# Patient Record
Sex: Female | Born: 1940 | Race: White | Hispanic: No | Smoking: Never smoker
Health system: Southern US, Community
[De-identification: ages and names within clinical notes are randomized; demographics above are authoritative.]

## PROBLEM LIST (undated history)

## (undated) DIAGNOSIS — I8390 Asymptomatic varicose veins of unspecified lower extremity: Secondary | ICD-10-CM

## (undated) DIAGNOSIS — N189 Chronic kidney disease, unspecified: Secondary | ICD-10-CM

## (undated) DIAGNOSIS — E7800 Pure hypercholesterolemia, unspecified: Secondary | ICD-10-CM

## (undated) DIAGNOSIS — K5909 Other constipation: Secondary | ICD-10-CM

## (undated) DIAGNOSIS — I10 Essential (primary) hypertension: Secondary | ICD-10-CM

## (undated) DIAGNOSIS — R7303 Prediabetes: Principal | ICD-10-CM

## (undated) DIAGNOSIS — K219 Gastro-esophageal reflux disease without esophagitis: Secondary | ICD-10-CM

## (undated) DIAGNOSIS — R600 Localized edema: Secondary | ICD-10-CM

## (undated) HISTORY — DX: Chronic kidney disease, unspecified: N18.9

## (undated) HISTORY — PX: OTHER SURGICAL HISTORY: SHX169

## (undated) HISTORY — PX: CATARACT EXTRACTION: SUR2

## (undated) HISTORY — DX: Other constipation: K59.09

## (undated) HISTORY — DX: Asymptomatic varicose veins of unspecified lower extremity: I83.90

## (undated) HISTORY — DX: Localized edema: R60.0

## (undated) HISTORY — DX: Essential (primary) hypertension: I10

## (undated) HISTORY — DX: Gastro-esophageal reflux disease without esophagitis: K21.9

## (undated) HISTORY — DX: Prediabetes: R73.03

## (undated) HISTORY — PX: LASIK: SHX215

## (undated) HISTORY — DX: Pure hypercholesterolemia, unspecified: E78.00

## (undated) HISTORY — PX: COLONOSCOPY: SHX174

## (undated) HISTORY — PX: BREAST EXCISIONAL BIOPSY: SUR124

---

## 1985-03-23 HISTORY — PX: ABDOMINAL HYSTERECTOMY: SHX81

## 1998-08-26 ENCOUNTER — Ambulatory Visit (HOSPITAL_COMMUNITY): Admission: RE | Admit: 1998-08-26 | Discharge: 1998-08-26 | Payer: Self-pay | Admitting: Family Medicine

## 1998-08-26 ENCOUNTER — Encounter: Payer: Self-pay | Admitting: Family Medicine

## 1999-02-05 ENCOUNTER — Encounter: Payer: Self-pay | Admitting: Family Medicine

## 1999-02-05 ENCOUNTER — Encounter: Admission: RE | Admit: 1999-02-05 | Discharge: 1999-02-05 | Payer: Self-pay | Admitting: Family Medicine

## 2000-02-13 ENCOUNTER — Encounter: Admission: RE | Admit: 2000-02-13 | Discharge: 2000-02-13 | Payer: Self-pay | Admitting: Family Medicine

## 2000-02-13 ENCOUNTER — Encounter: Payer: Self-pay | Admitting: Family Medicine

## 2000-03-27 ENCOUNTER — Ambulatory Visit (HOSPITAL_COMMUNITY): Admission: RE | Admit: 2000-03-27 | Discharge: 2000-03-27 | Payer: Self-pay | Admitting: Family Medicine

## 2001-10-07 ENCOUNTER — Encounter: Payer: Self-pay | Admitting: Emergency Medicine

## 2001-10-07 ENCOUNTER — Emergency Department (HOSPITAL_COMMUNITY): Admission: EM | Admit: 2001-10-07 | Discharge: 2001-10-07 | Payer: Self-pay | Admitting: Emergency Medicine

## 2003-04-25 ENCOUNTER — Other Ambulatory Visit: Admission: RE | Admit: 2003-04-25 | Discharge: 2003-04-25 | Payer: Self-pay | Admitting: Family Medicine

## 2003-07-28 ENCOUNTER — Emergency Department (HOSPITAL_COMMUNITY): Admission: EM | Admit: 2003-07-28 | Discharge: 2003-07-29 | Payer: Self-pay

## 2005-08-25 ENCOUNTER — Encounter: Admission: RE | Admit: 2005-08-25 | Discharge: 2005-08-25 | Payer: Self-pay | Admitting: Family Medicine

## 2006-07-13 ENCOUNTER — Inpatient Hospital Stay (HOSPITAL_COMMUNITY): Admission: AD | Admit: 2006-07-13 | Discharge: 2006-07-15 | Payer: Self-pay | Admitting: Cardiology

## 2006-07-14 ENCOUNTER — Encounter (INDEPENDENT_AMBULATORY_CARE_PROVIDER_SITE_OTHER): Payer: Self-pay | Admitting: Interventional Cardiology

## 2006-08-02 ENCOUNTER — Ambulatory Visit: Payer: Self-pay | Admitting: Cardiovascular Disease

## 2006-08-02 ENCOUNTER — Ambulatory Visit (HOSPITAL_COMMUNITY): Admission: RE | Admit: 2006-08-02 | Discharge: 2006-08-02 | Payer: Self-pay | Admitting: Cardiology

## 2006-08-06 ENCOUNTER — Ambulatory Visit: Payer: Self-pay | Admitting: Internal Medicine

## 2006-08-09 ENCOUNTER — Ambulatory Visit: Payer: Self-pay | Admitting: Internal Medicine

## 2006-08-17 ENCOUNTER — Ambulatory Visit (HOSPITAL_COMMUNITY): Admission: RE | Admit: 2006-08-17 | Discharge: 2006-08-17 | Payer: Self-pay | Admitting: Internal Medicine

## 2006-08-20 ENCOUNTER — Ambulatory Visit: Payer: Self-pay

## 2006-08-20 ENCOUNTER — Ambulatory Visit: Payer: Self-pay | Admitting: Internal Medicine

## 2006-09-15 ENCOUNTER — Ambulatory Visit: Payer: Self-pay | Admitting: Internal Medicine

## 2006-10-05 ENCOUNTER — Encounter: Admission: RE | Admit: 2006-10-05 | Discharge: 2006-10-05 | Payer: Self-pay | Admitting: Family Medicine

## 2007-01-05 ENCOUNTER — Ambulatory Visit: Payer: Self-pay | Admitting: Internal Medicine

## 2007-04-06 ENCOUNTER — Ambulatory Visit: Payer: Self-pay | Admitting: Internal Medicine

## 2007-11-02 ENCOUNTER — Encounter: Admission: RE | Admit: 2007-11-02 | Discharge: 2007-11-02 | Payer: Self-pay | Admitting: Family Medicine

## 2007-11-16 ENCOUNTER — Encounter: Admission: RE | Admit: 2007-11-16 | Discharge: 2007-11-16 | Payer: Self-pay | Admitting: Family Medicine

## 2009-05-15 ENCOUNTER — Encounter: Admission: RE | Admit: 2009-05-15 | Discharge: 2009-05-15 | Payer: Self-pay | Admitting: Family Medicine

## 2009-05-29 ENCOUNTER — Encounter: Admission: RE | Admit: 2009-05-29 | Discharge: 2009-05-29 | Payer: Self-pay | Admitting: Family Medicine

## 2009-11-27 ENCOUNTER — Encounter: Admission: RE | Admit: 2009-11-27 | Discharge: 2009-11-27 | Payer: Self-pay | Admitting: Family Medicine

## 2010-01-15 LAB — HM DEXA SCAN

## 2010-04-13 ENCOUNTER — Encounter: Payer: Self-pay | Admitting: Family Medicine

## 2010-04-14 ENCOUNTER — Encounter: Payer: Self-pay | Admitting: Family Medicine

## 2010-04-23 ENCOUNTER — Other Ambulatory Visit: Payer: Self-pay | Admitting: Family Medicine

## 2010-04-23 DIAGNOSIS — Z09 Encounter for follow-up examination after completed treatment for conditions other than malignant neoplasm: Secondary | ICD-10-CM

## 2010-05-16 ENCOUNTER — Ambulatory Visit
Admission: RE | Admit: 2010-05-16 | Discharge: 2010-05-16 | Disposition: A | Discharge status: Home / Self Care | Payer: Medicare Other | Source: Ambulatory Visit | Attending: Family Medicine | Admitting: Family Medicine

## 2010-05-16 DIAGNOSIS — Z09 Encounter for follow-up examination after completed treatment for conditions other than malignant neoplasm: Secondary | ICD-10-CM

## 2010-08-05 NOTE — Letter (Signed)
Aug 20, 2006    Dario Guardian, M.D.  [address]  [place], [place] [address]   RE:  Valerie Nguyen, Valerie Nguyen  MRN:  [id number]  /  DOB:  1940-12-10   Dictation ended at this point.    Sincerely,      Duke Salvia, MD, [facility]  Electronically Signed    SCK/MedQ  DD: 08/20/2006  DT: 08/21/2006  Job #: [id number]

## 2010-08-05 NOTE — Letter (Signed)
Aug 20, 2006    Dario Guardian, M.D.  [address]  [place], [place] [address]   RE:  Valerie Nguyen, Valerie Nguyen  MRN:  [id number]  /  DOB:  1941/01/24   Dear Valerie Nguyen:   Valerie Nguyen came in today for ongoing evaluation for ventricular  tachycardia in the setting of an abnormal MRI.  Her signal average ECG  was normal.  Her metoprolol at 100 mg a day was associated with marked  improvement in her symptoms, and inadvertently we had her stop it today  in anticipation of her treadmill, and she came in with long runs of non-  sustained ventricular tachycardia.   Her blood pressure was 154/94.  LUNGS:  Clear.  HEART:  Sounds were regular.   I have suggested that we increase her metoprolol to 100 mg twice daily  and she is to make sure that this is a sustained form.   We will plan to see her again in 3 weeks' time or so.  In the event that  she has ongoing symptoms, alternatives would include the addition of a  class IC antiarrhythmic drug, as well as catheter ablation.   In the interim, we will need to get her MRI copied, and I will have Dr.  Modena Nunnery at [facility] take a look at it.   At this point, she fails to meet formal criteria for arrhythmogenic RV  dysplasia, although I think I may have mentioned to you previously that  they are in the process of rewriting these criteria.   Valerie Nguyen, again, thank you for all of your help and for allowing [place] to  participate in the care of your patient.    Sincerely,      Valerie Salvia, MD, [facility]  Electronically Signed    SCK/MedQ  DD: 08/20/2006  DT: 08/20/2006  Job #: [id number]   CC:    Armanda Magic, M.D.

## 2010-08-05 NOTE — Letter (Signed)
Aug 06, 2006    Valerie Nguyen, M.D.  [address]  [place], [place] [address]   RE:  Valerie Nguyen  MRN:  [id number]  /  DOB:  03/14/1941   Dear Valerie Nguyen,   It was a pleasure to see Valerie Nguyen at your request today.  Valerie Nguyen is a 70 year old woman who has been having problems with  palpitations, which by event recorder have been demonstrated to be  sustained ventricular tachycardia with a morphology QRS that is similar  to what appears to be lead II or V6.   As you know, she is a 70 year old woman who is under a great deal of  stress.  Her husband is disabled, and she cares for him at home.  Her  daughter is a recently recovered addict, whom she also cares for at  home, who had some palpitations about 5-6 years ago.   Over the last three months, she has noted increasing problems with  palpitations.  These have been accompanied by a sense of some shortness  of breath and a chest pressure.  Because of the aforementioned  constellation, she underwent catheterization that demonstrated normal  left ventricular function.  Somewhere I read in the reports that you  kindly furnished, that there was a question of an apical endocardial  echogenicity, the cause of which was unclear.  With that in mind, she  underwent MRI scanning, which was notable for normal left ventricular  function.  The left ventricular cavity was described as normal.  The  right ventricular cavity, however, was notable for areas of dyssynergy  on the free wall as well as areas of dyskinesis.  This area apparently  corresponded to significant fatty infiltration and a feeling that this  was consistent with ARVD.   The patient has no history of syncope nor any known family history.  Her  father died suddenly, but this was in the context of an MI.  He  apparently was resuscitated and had another MI in the hospital.  His  grandfather died suddenly.  There is no premature death that is noted.   She  herself has no syncope.  She does have episodes of dizziness but  this always occurs with rapid changes in rotation of a vertical  position, not with standing.   She does not have problems while driving, looking over her shoulder,  wearing tight collars, or with looking up.   PAST MEDICAL HISTORY:  Her past medical history, in addition to the  above, is notable for:  1. Hypertension.  2. Dyslipidemia.  3. History of cellulitis.   PAST SURGICAL HISTORY:  Notable for an abdominal hysterectomy.   REVIEW OF SYSTEMS:  In addition to the above is notable for:  1. GE reflux disease.  2. Constipation.  3. Anemia.  4. Arthritis.  5. Venous insufficiency and secondary venous stasis.   SOCIAL HISTORY:  She is married, as previously noted.  She has three  children.  She does not use cigarettes, alcohol, or recreational drugs.  She does drink a fair amount of caffeine but has noted no particular  association with her ventricular tachycardia.  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   MEDICATIONS:  Her medications include hydrochlorothiazide, metoprolol  50, now taking 100, Vytorin, and potassium.   PHYSICAL EXAMINATION:  GENERAL:  She is an older Caucasian female  appearing her stated age of 70.  VITAL SIGNS:  Her blood pressure is 145/82.  Her pulse is 69.  There is  no orthostatic change.  HEENT:  No icterus or xanthomata.  NECK:  Neck veins are flat.  Carotids are brisk and full bilaterally  without bruits.  BACK:  Without kyphosis or scoliosis.  LUNGS:  Clear.  CARDIAC:  Heart sounds were regular today without murmurs or gallops.  ABDOMEN:  Soft with active bowel sounds.  No midline pulsation or  hepatomegaly.  EXTREMITIES:  Femoral pulses were 2+.  Distal pulses were intact.  There  was no clubbing, cyanosis or edema.  NEUROLOGIC:  Grossly normal.  SKIN:  Warm and dry.   Electrocardiogram dated today demonstrated sinus rhythm.  The intervals  are 0.14, 0.18, 0.4.   There was no evidence of significant QRS  prolongation in the anterior precordium, although the QRS in the  anterior precordium appears to be about 10-20 ms longer than it is  anywhere else.  In addition, there is no evidence of T wave inversions  in the anterior precordium.   IMPRESSION:  1. Recurrent monomorphic ventricular tachycardia, either repetitive      monomorphic or sustained with an axis that is suggestive of an      inferior and probably left bundle branch block pattern, consistent      with a right ventricular origin.  2. 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   This would give her either one or two points based on the subjective  interpretation of the right ventricle.  She would have one minor for her  left ventricular tachycardia.   To that end, we will plan to undertake an evaluation, including her  Holter monitor, signal average electrocardiogram, and stress testing.  In the wake of that, we will need to try to further clarify possibly by  sending her MRI scan to [facility] to assess the likelihood that this  is arrhythmogenic right ventricular dysplasia as opposed to simply right  ventricular outflow tract tachycardia, based on what will turn out to be  likely a probable diagnosis of scale of 3 points by task force criteria.   We will plan to see her again in a couple of weeks to review the  aforementioned testing.   Valerie Nguyen, thanks very much for asking [place] to see her.    Sincerely,      Valerie Salvia, MD, [facility]  Electronically Signed    SCK/MedQ  DD: 08/06/2006  DT: 08/06/2006  Job #: [id number]   CC:    Valerie Nguyen, M.D.

## 2010-08-05 NOTE — Discharge Summary (Signed)
Valerie, Nguyen                ACCOUNT NO.:  [id number]   MEDICAL RECORD NO.:  [id number]          PATIENT TYPE:  INP   LOCATION:  3736                         FACILITY:  [facility]   PHYSICIAN:  Armanda Magic, M.D.     DATE OF BIRTH:  05-13-1940   DATE OF ADMISSION:  07/13/2006  DATE OF DISCHARGE:  07/15/2006                               DISCHARGE SUMMARY   DISCHARGE DIAGNOSES:  1. Chest pain, felt to be noncardiac.  2. Nonsustained ventricular tachycardia.  3. Normal left ventricular function.  4. Hypokalemia repleted.  5. Chronic anemia.  6. Hypertension, treated.  7. Hyperlipidemia, treated.   HOSPITAL COURSE:  Valerie Nguyen is a 70 year old female who was admitted to  [facility] on July 13, 2006 with shortness of breath.  The week  prior to her admission, she was placed on a vent monitor secondary to  palpitation, the monitor revealed episodes of nonsustained ventricular  tachycardia.  For these reasons she was admitted to [facility].   Ultimately she underwent cardiac catheterization that showed  nonobstructive coronary artery disease with a 20% lesion in her mid LAD.  Because of her nonsustained ventricular tachycardia, her medications  were adjusted and she was discharged to home in stable condition.   LABORATORY DATA:  Include a hemoglobin of 10.7, hematocrit 32, platelets  251, white count 9.  Sodium 141, potassium 3.4, repleted.  BUN 18,  creatinine 0.93, cardiac isoenzymes negative.  BNP 139, total  cholesterol 100, triglycerides 113, HDL 29, LDL 48, TSH 0.967.   EKG normal sinus rhythm, rate 72 with nonspecific inferolateral ST-T  wave changes.   DISCHARGE INSTRUCTIONS:  1. Clean cath site gently with soap and water.  2. Remain on a low-sodium, heart healthy diet.  3. No lifting over 10 pounds for 1 week.  4. No driving for 2 days.  5. Followup with Dr. Mayford Knife on Jul 22, 2006 at 11:30 a.m.   DISCHARGE MEDICATIONS:  1. She is to stop her verapamil.  2. She is to  start Toprol XL 50 mg one p.o. daily.  3. Continue aspirin 325 mg daily.  4. Hydrochlorothiazide 25 mg daily.  5. Vytorin 10/20 one tablet daily.  6. Multivitamin daily.  7. Tums daily.   Please note the patient did have a 2D echo during her hospitalization,  LV systolic function was normal, 16% with no wall motion abnormalities.  There was true aortic valvular regurgitation.      Guy Franco, P.A.      Armanda Magic, M.D.  Electronically Signed    LB/MEDQ  D:  08/20/2006  T:  08/21/2006  Job:  [id number]   cc:   Dario Guardian, M.D.

## 2010-08-05 NOTE — Letter (Signed)
September 15, 2006    Dario Guardian, M.D.  [address]  [place], [place] [address]   RE:  FRANCINA, BEERY  MRN:  [id number]  /  DOB:  [birth year]   Dear Drue Flirt,   Jaylynn Mcaleer is doing much better. There was some confusion about her  Toprol dose. Her blood pressure is up a little bit and she has had a few  palpitations but overall she is much improved. I have not yet heard back  from Dr. Sharlene Dory regarding her MRI evaluation.   PHYSICAL EXAMINATION:  VITAL SIGNS:  Her blood pressure is 130/90, pulse  is 64.  LUNGS:  Clear.  HEART:  Heart sounds were regular.   IMPRESSION:  1. RVOTVT.  2. 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  3. Hypertension.   Will plan to increase her Toprol today from 50-100 mg a day.   We will see her again in 4 months' time.    Sincerely,      Duke Salvia, MD, [facility]  Electronically Signed    SCK/MedQ  DD: 09/15/2006  DT: 09/16/2006  Job #: [id number]   CC:    Armanda Magic, M.D.

## 2010-08-05 NOTE — Assessment & Plan Note (Signed)
 HEALTHCARE                         ELECTROPHYSIOLOGY OFFICE NOTE   NAME:NILSENKimbely, Whiteaker                         MRN:          [id number]  DATE:01/05/2007                            DOB:          20-Sep-1940    Renne Crigler is seen for left bundle inferior access morphology and  ventricular ectopy.   These interestingly notched in leads V1 and V2.  They have been well  controlled with metoprolol, which he takes in a succinate formulation 50  mg twice daily.   PHYSICAL EXAMINATION:  VITAL SIGNS:  Her blood pressure is mildly  elevated at 156/85.  LUNGS:  Clear.  HEART:  Sounds were regular.  EXTREMITIES:  Without edema.   Electrocardiogram today was normal.   IMPRESSION:  1. Modest hypertension.  2. Left bundle inferior access morphology, ventricular ectopy coming      from either the posterior wall or the left ventricular outflow      tract.   Ms. Gebert is stable.  We continue to wait to hear from Dr. Juanetta Gosling  regarding the MRI.  I may need to call him.     Duke Salvia, MD, [facility]  Electronically Signed    SCK/MedQ  DD: 01/05/2007  DT: 01/06/2007  Job #: [id number]   cc:   Armanda Magic, M.D.

## 2010-08-05 NOTE — Assessment & Plan Note (Signed)
[facility]                         ELECTROPHYSIOLOGY OFFICE NOTE   NAME:NILSENMikaila, Grunert                         MRN:          [id number]  DATE:04/06/2007                            DOB:          [birth year]    HISTORY:  Jammy Stlouis is seen in followup for ventricular tachycardia  occurring in the setting of an abnormal MRI felt to be consistent with  arrhythmogenic cardiomyopathy.  She is currently being medicated with a  combination of metoprolol, magnesium and is doing quite well with only  rare occurrences.  Lasting though sometimes up to 3-4 hours.   PHYSICAL EXAMINATION:  VITAL SIGNS:  Blood pressure 148/90, pulse 88.  LUNGS:  Clear.  HEART:  Sounds were regular.  EXTREMITIES:  Without edema.   IMPRESSION:  1. Ventricular tachycardia.  2. 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  3. Hypertension.   PLAN:  We will plan to re-collect an MRI.  Again, I will try to get in  touch with [place] at [facility] for [employer].     Duke Salvia, MD, [facility]  Electronically Signed    SCK/MedQ  DD: 04/06/2007  DT: 04/06/2007  Job #: [id number]   cc:   Armanda Magic, M.D.  Dario Guardian, M.D.

## 2010-08-08 NOTE — Letter (Signed)
April 27, 2007    [place], MD  [address]  [place], [place]  [address]   RE:  JORGIA, MANTHEI  MRN:  [id number]  /  DOB:  01-Jan-1941   Dear Jena Gauss:   I hope you, your wife and your daughters are doing well.   I was wondering if you would mind looking at this MRI. It was done  because of ventricular arrhythmias and was felt to be markedly abnormal  by our guys here. Further evaluation of her RV, however, failed to  identify anything else in support of arrhythmogenic cardiomyopathy. I  was hoping to get your insight as to what this scan looked like and then  decide what further testing should be done.   When you call, I would also like to talk to you about this other kindred  that was referred up from down here. I was involved with one of the sons  and the question that came up I did not have a good answer for was the  issue of ongoing surveillance of members of a kindred in whom a primary  diagnosis has been made.   I look forward to talking to you soon and seeing you if not before  certainly at [facility].    Sincerely,      Duke Salvia, MD, [facility]  Electronically Signed    SCK/MedQ  DD: 04/27/2007  DT: 04/27/2007  Job #: [id number]

## 2010-08-08 NOTE — Cardiovascular Report (Signed)
Valerie Nguyen, Valerie Nguyen                ACCOUNT NO.:  [id number]   MEDICAL RECORD NO.:  [id number]          PATIENT TYPE:  INP   LOCATION:  3736                         FACILITY:  [facility]   PHYSICIAN:  Armanda Magic, M.D.     DATE OF BIRTH:  1941-01-11   DATE OF PROCEDURE:  07/15/2006  DATE OF DISCHARGE:                            CARDIAC CATHETERIZATION   PROCEDURES:  1. Left heart catheterization.  2. Coronary angiography.  3. Left ventriculography.   OPERATOR:  Armanda Magic, M.D.   INDICATIONS:  Chest pain and ventricular tachycardia.   COMPLICATIONS:  None.   INTRAVENOUS ACCESS:  Via right femoral artery, 6-French sheath.   This is a very pleasant 70 year old white female who presents with chest  pain and tachy palpitations and was found to have nonsustained  ventricular tachycardia on event monitor and now presents for cardiac  catheterization.   The patient was brought to cardiac catheterization laboratory in the  fasting non-sedated state.  Informed consent was obtained.  The patient  was connected to continuous heart rate and pulse oximetry monitoring,  intermittent blood pressure monitoring.  The right groin was prepped and  draped in a sterile fashion.  Xylocaine 1% was used for local  anesthesia.  Using the modified Seldinger technique, a 6-French sheath  was placed in the right femoral artery.  Under fluoroscopic guidance, a  6-French JL-4 catheter was placed in the left coronary artery.  Multiple  cine films were taken at 30-degree RAO and 40-degree LAO views.  His  catheter was exchanged out over a guidewire for a 6-French JR-4  catheter, which was placed in fluoroscopic guidance in the right  coronary artery.  Multiple cine films taken at 30-degree RAO and 40-  degree LAO views.  This catheter was exchanged out over a guidewire for  a 6-French angled pigtail catheter, which was placed in fluoroscopic  guidance in the left ventricular cavity.  Left ventriculography  was  performed in the 30-degree RAO view using a total of 30 mL of contrast  at 15 mL/sec.  The catheter was then pulled back across the aortic valve  with no significant gradient noted.  At the end procedure, all catheters  and sheaths were removed.  Manual compression was performed till  adequate hemostasis was obtained.  The patient was transferred back to  the room in stable condition.   RESULTS:  Left main coronary artery is widely patent.  It bifurcates  into the left anterior descending artery and left circumflex artery.   The left anterior descending artery is widely patent throughout its  course with a 20% eccentric narrowing in the mid portion.  It gives rise  to a large first diagonal branch which bifurcates into two daughter  branches and is widely patent.   The left circumflex is widely patent throughout its course, giving rise  to a first obtuse marginal branch which is widely patent, and then it  terminates distally in a second obtuse marginal branch which is widely  patent.   The right coronary artery has luminal irregularities of 20%.  It  bifurcates distally  into the posterior descending artery and posterior  lateral artery both of which are widely patent.   LEFT VENTRICULOGRAPHY:  Shows normal LV systolic function.  EF 65%.  Left ventricular pressure 162/8.  Aortic pressure was 158/73.   ASSESSMENT:  1. Nonobstructive coronary disease.  2. Normal left ventricular function.  3. Nonsustained ventricular tachycardia.  4. Noncardiac chest pain.   PLAN:  1. Discharge to home after IV fluid and bedrest.  2. Discontinue verapamil and start Toprol XL 50 mg a day.  3. Continue to wear event monitor to assess suppression of VT on beta      blockers.  4. Follow up with me in 1 week.      Armanda Magic, M.D.  Electronically Signed     TT/MEDQ  D:  07/15/2006  T:  07/15/2006  Job:  [id number]

## 2010-08-08 NOTE — H&P (Signed)
Valerie Nguyen, TOM NO.:  [id number]   MEDICAL RECORD NO.:  [id number]          PATIENT TYPE:  INP   LOCATION:  3736                         FACILITY:  [facility]   PHYSICIAN:  Armanda Magic, M.D.     DATE OF BIRTH:  [birth year]   DATE OF ADMISSION:  07/13/2006  DATE OF DISCHARGE:                              HISTORY & PHYSICAL   PRIMARY CARE PHYSICIAN:  Valerie Nguyen, M.D., [facility] physicians.   CARDIOLOGIST:  Armanda Magic, M.D.   REASON FOR EVALUATION:  Palpitations, chest pain, and shortness of  breath.   HISTORY OF PRESENT ILLNESS:  Valerie Nguyen is a 70 year old female of  [place] descent with a history of hypertension and dyslipidemia.  She  was recently placed on an event monitor a week ago secondary to  palpitations and felt like her heart was pounding.  The monitor company  called and sent a report revealing episodes of nonsustained ventricular  tachycardia or a wide complex tachycardia.  A 12-lead EKG in the office  today revealed normal sinus rhythm with a ventricular rate of 74 beats  per minute.  There was no evidence of acute ischemic changes.  Today she  complained of occasional palpitations when lifting her husband who is  handicapped.  She denied chest pain or shortness of breath, however  admits to dyspnea on exertion.  She is being admitted to the [facility] for further evaluation of wide complex tachycardia.   PAST MEDICAL HISTORY:  1. Hypertension.  2. Dyslipidemia.  3. History of cellulitis  4. Status post total abdominal hysterectomy.   ALLERGIES:  No known drug allergies.   CURRENT MEDICATIONS:  1. Vytorin 10/20 mg daily.  2. Hydrochlorothiazide 25 mg daily.  3. Verapamil 180 mg daily.  4. Aspirin 325 mg daily.  5. Multivitamin 1 tablet daily.  6. Tums 4 times daily.   FAMILY HISTORY:  Father deceased at age 17, MI.  Mother deceased at age  51 of breast cancer. She has a brother living at age 64 with  hypertension.   Sister living, age 37, with hypertension.   SOCIAL HISTORY:  Married with three adult children.  She is employed at  [employer].  She denies tobacco, alcohol, or illicit drug use.  She  admits to drinking four to five 8 ounces cups of coffee per day.   REVIEW OF SYSTEMS:  All other systems reviewed are negative other than  what is stated in the HPI.   PHYSICAL EXAMINATION:  GENERAL:  A 70 year old female, pleasant and  cooperative, no acute distress.  VITAL SIGNS:  Blood pressure 138/88 with a pulse 80, weight 165.6  pounds.  HEENT: Unremarkable.  NECK: Supple without JVD or bilateral carotid bruits.  Carotid upstrokes  2+.  PULMONARY:  Breath sounds are equal and clear to auscultation  bilaterally.  No use of accessory muscles.  CARDIOVASCULAR:  Regular rate and rhythm.  Normal S1-S2 without murmurs,  gallops, clicks or rubs.  ABDOMEN: Benign.  EXTREMITIES: No peripheral edema, clubbing, or cyanosis.  DP pulses 2+/2  bilaterally.  NEUROLOGIC:  No  focal motor or sensory deficits.  PSYCH:  Normal mood and affect.  SKIN:  Warm and dry without rashes or lesions.  BACK:  No kyphosis or scoliosis.   LABORATORY DATA:  A 12-lead EKG July 13, 2006, revealed normal sinus  rhythm with a ventricular rate of 74 beats per minute.  There was no  evidence of acute ST-T wave changes.   ASSESSMENT:  K1.  Palpitations secondary to a wide complex tachycardia,  nonsustained ventricular tachycardia.  1. Hypertension, controlled.  2. Dyslipidemia, controlled.  3. History of cellulitis.  4. Status post total abdominal hysterectomy.   PLAN:  1. Admit to a cardiac telemetry unit under the service of Dr. Armanda Magic with a history of ventricular tachycardia.  2. Rule out MI with cardiac panel q.8 h x3.  3. A 2-D echocardiogram for evaluation of LV function.  4. Start IV at 0.9% sodium chloride at 50 mL per hour.  5. Check BMET, CBC, PT, PTT, TSH, BNP, portable chest x-ray and EKG       upon admission.  6. Daily BMET, CBC and EKG x2.  7. Fasting lipid panel in the morning.  8. Continue home medications with the exception of holding verapamil      180 mg daily.  9. Start Lopressor 25 mg twice daily.  Hold for systolic blood      pressure less than 100 or heart rate less than 80.  10.Cardiac catheterization on Thursday, July 15, 2006, to be      performed by Dr. Armanda Magic to rule out ischemia.  The cardiac      catheterization procedure, risks, and potential complications were      explained to the patient in detail including MI, CVA, death, IV      contrast dye allergy, renal insufficiency, and vascular      complications including bleeding, hematoma, or pseudoaneurysm.  The      patient admits to full understanding of the information and wishes      to proceed.  11.N.P.O. after midnight Wednesday, July 14, 2006.   The patient was seen, interviewed, and examined by Dr. Armanda Magic who  participated in the medical decision making and plan of care.      Tylene Fantasia, [place]      Armanda Magic, M.D.  Electronically Signed    RDM/MEDQ  D:  07/13/2006  T:  07/13/2006  Job:  [id number]   cc:   Valerie Nguyen, M.D.

## 2011-04-15 ENCOUNTER — Other Ambulatory Visit: Payer: Self-pay | Admitting: Family Medicine

## 2011-04-15 DIAGNOSIS — R921 Mammographic calcification found on diagnostic imaging of breast: Secondary | ICD-10-CM

## 2011-04-29 ENCOUNTER — Ambulatory Visit
Admission: RE | Admit: 2011-04-29 | Discharge: 2011-04-29 | Disposition: A | Discharge status: Home / Self Care | Payer: Medicare Other | Source: Ambulatory Visit | Attending: Family Medicine | Admitting: Family Medicine

## 2011-04-29 DIAGNOSIS — R921 Mammographic calcification found on diagnostic imaging of breast: Secondary | ICD-10-CM

## 2012-02-25 ENCOUNTER — Encounter: Payer: Self-pay | Admitting: Family Medicine

## 2012-03-24 ENCOUNTER — Ambulatory Visit (INDEPENDENT_AMBULATORY_CARE_PROVIDER_SITE_OTHER): Payer: Medicare Other | Admitting: Family Medicine

## 2012-03-24 ENCOUNTER — Encounter: Payer: Self-pay | Admitting: Family Medicine

## 2012-03-24 VITALS — BP 112/74 | Temp 98.0°F | Ht 62.5 in | Wt 160.0 lb

## 2012-03-24 DIAGNOSIS — N189 Chronic kidney disease, unspecified: Secondary | ICD-10-CM | POA: Insufficient documentation

## 2012-03-24 DIAGNOSIS — R739 Hyperglycemia, unspecified: Secondary | ICD-10-CM

## 2012-03-24 DIAGNOSIS — K219 Gastro-esophageal reflux disease without esophagitis: Secondary | ICD-10-CM

## 2012-03-24 DIAGNOSIS — R609 Edema, unspecified: Secondary | ICD-10-CM

## 2012-03-24 DIAGNOSIS — R600 Localized edema: Secondary | ICD-10-CM

## 2012-03-24 DIAGNOSIS — I10 Essential (primary) hypertension: Secondary | ICD-10-CM

## 2012-03-24 DIAGNOSIS — E785 Hyperlipidemia, unspecified: Secondary | ICD-10-CM

## 2012-03-24 DIAGNOSIS — I152 Hypertension secondary to endocrine disorders: Secondary | ICD-10-CM | POA: Insufficient documentation

## 2012-03-24 DIAGNOSIS — R7309 Other abnormal glucose: Secondary | ICD-10-CM

## 2012-03-24 HISTORY — DX: Chronic kidney disease, unspecified: N18.9

## 2012-03-24 HISTORY — DX: Gastro-esophageal reflux disease without esophagitis: K21.9

## 2012-03-24 HISTORY — DX: Localized edema: R60.0

## 2012-03-24 LAB — LIPID PANEL
Cholesterol: 149 mg/dL (ref 0–200)
LDL Cholesterol: 84 mg/dL (ref 0–99)
Total CHOL/HDL Ratio: 4
Triglycerides: 135 mg/dL (ref 0.0–149.0)

## 2012-03-24 LAB — BASIC METABOLIC PANEL
GFR: 50.4 mL/min — ABNORMAL LOW (ref >60.00)
Potassium: 3.5 mEq/L (ref 3.5–5.1)

## 2012-03-24 LAB — HEMOGLOBIN A1C: Hgb A1c MFr Bld: 6.5 % (ref 4.6–6.5)

## 2012-03-24 NOTE — Progress Notes (Signed)
Chief Complaint  Patient presents with  . Establish Care    HPI:  Ritha Sampedro is here to establish care. Patient used to [facility], with Dr. Katrinka Blazing, but they no longer take her insurance. Last PCP and physical: reports had physical in 12/2011 with prior PCP Work:part time with [employer] Home Situation: Lives with daughter Spiritual Beliefs: Lifestyle: no regular exercise  Has the following chronic problems and concerns today:  HTN: -stable on metoprolol and HCTZ -on ASA, no hx of CAD, hx of palpitations and saw a cardiologist -denies: CP, palpitations, SOB -gets swelling in LE chronically, does not use compression  CKD: -reports told per labs has CKD -reports did not need any medications or other evaluation  HLD: -takes zocor -stable  GERD: -stable on pepcid  Other Providers:  Health Maintenance: -had CPE in October, mammogram this year she reports - and fine, last colonoscopy 2009 fine -flu vaccine: refused  ROS: See pertinent positives and negatives per HPI.  Past Medical History  Diagnosis Date  . Hypertension   . High cholesterol     Family History  Problem Relation Age of Onset  . Breast cancer Mother   . Hypertension Mother   . Alzheimer's disease Mother   . Hypertension Father     History   Social History  . Marital Status: Married    Spouse Name: N/A    Number of Children: N/A  . Years of Education: N/A   Social History Main Topics  . Smoking status: Never Smoker   . Smokeless tobacco: None  . Alcohol Use: No  . Drug Use: None  . Sexually Active: None   Other Topics Concern  . None   Social History Narrative  . None    Current outpatient prescriptions:aspirin 325 MG tablet, Take 325 mg by mouth daily., Disp: , Rfl: ;  calcium gluconate 500 MG tablet, Take 500 mg by mouth daily., Disp: , Rfl: ;  famotidine (PEPCID) 20 MG tablet, Take 20 mg by mouth daily., Disp: , Rfl: ;  hydrochlorothiazide (HYDRODIURIL) 25 MG tablet, Take 25 mg by mouth  daily. , Disp: , Rfl: ;  metoprolol (LOPRESSOR) 50 MG tablet, Take 50 mg by mouth 2 (two) times daily., Disp: , Rfl:  simvastatin (ZOCOR) 40 MG tablet, Take 40 mg by mouth at bedtime. , Disp: , Rfl:   EXAM:  Filed Vitals:   03/24/12 1248  BP: 112/74  Temp: 98 F (36.7 C)    Body mass index is 28.80 kg/(m^2).  GENERAL: vitals reviewed and listed above, alert, oriented, appears well hydrated and in no acute distress  HEENT: atraumatic, conjunttiva clear, no obvious abnormalities on inspection of external nose and ears  NECK: no obvious masses on inspection  LUNGS: clear to auscultation bilaterally, no wheezes, rales or rhonchi, good air movement  CV: HRRR, tr peripheral edema, spider veins  MS: moves all extremities without noticeable abnormality  PSYCH: pleasant and cooperative, no obvious depression or anxiety  ASSESSMENT AND PLAN:  Discussed the following assessment and plan:  1. Hyperglycemia  Hemoglobin A1c  2. CKD (chronic kidney disease)  Basic metabolic panel, hydration  3. Hypertension  Basic metabolic panel, cont current tx  4. GERD (gastroesophageal reflux disease)  Cont tx  5. Hyperlipidemia  Lipid Panel, cont current tx  6. Lower extremity edema  With spider veins, comp stocking rx given, elevation at night   NON-FASTING LABS today  -We reviewed the PMH, PSH, FH, SH, Meds and Allergies. -PT reports good  on meds currently and will notify pharmacy and call us if needs refills -We addressed current concerns per orders and patient instructions. -We have asked for records for pertinent exams, studies, vaccines and notes from previous providers. -We have advised patient to follow up per instructions below. -Pt refused influenza vaccine  -Patient advised to return or notify a doctor immediately if symptoms worsen or persist or new concerns arise.  Patient Instructions  -We have ordered labs or studies at this visit. It can take up to 1-2 weeks for results and  processing. We will contact you with instructions IF your results are abnormal. Normal results will be released to your [facility]. If you have not heard from [place] or can not find your results in [facility] in 2 weeks please contact our office.  -PLEASE SIGN UP FOR MYCHART TODAY   We recommend the following healthy lifestyle measures: - eat a healthy diet consisting of lots of vegetables, fruits, beans, nuts, seeds, healthy meats such as white chicken and fish and whole grains.  - avoid fried foods, fast food, processed foods, sodas, red meet and other fattening foods.  - get a least 150 minutes of aerobic exercise per week.   Follow up in: 3 months or sooner if any concerns      Mersadies Petree R.

## 2012-03-24 NOTE — Patient Instructions (Addendum)
-  We have ordered labs or studies at this visit. It can take up to 1-2 weeks for results and processing. We will contact you with instructions IF your results are abnormal. Normal results will be released to your [facility]. If you have not heard from [place] or can not find your results in [facility] in 2 weeks please contact our office.  -PLEASE SIGN UP FOR MYCHART TODAY   We recommend the following healthy lifestyle measures: - eat a healthy diet consisting of lots of vegetables, fruits, beans, nuts, seeds, healthy meats such as white chicken and fish and whole grains.  - avoid fried foods, fast food, processed foods, sodas, red meet and other fattening foods.  - get a least 150 minutes of aerobic exercise per week.   Follow up in: 3 months or sooner if any concerns

## 2012-03-25 ENCOUNTER — Telehealth: Payer: Self-pay | Admitting: Family Medicine

## 2012-03-25 NOTE — Telephone Encounter (Signed)
Please let her know:  Cholesterol looks ok (good cholesterol is a little low) Mild kidney disease Mildly elevated diabetes screening lab  Would advise a healthy diet, regular exercise, drink plenty of water and follow up in 1-2 months.

## 2012-03-25 NOTE — Telephone Encounter (Signed)
Called and spoke with pt and pt is aware. Pt states she understands lab results.

## 2012-06-15 ENCOUNTER — Other Ambulatory Visit: Payer: Self-pay

## 2012-06-15 DIAGNOSIS — Z1231 Encounter for screening mammogram for malignant neoplasm of breast: Secondary | ICD-10-CM

## 2012-06-22 ENCOUNTER — Encounter: Payer: Self-pay | Admitting: Family Medicine

## 2012-06-22 ENCOUNTER — Ambulatory Visit (INDEPENDENT_AMBULATORY_CARE_PROVIDER_SITE_OTHER): Payer: Medicare Other | Admitting: Family Medicine

## 2012-06-22 VITALS — BP 138/88 | HR 60 | Temp 97.7°F | Wt 163.0 lb

## 2012-06-22 DIAGNOSIS — N189 Chronic kidney disease, unspecified: Secondary | ICD-10-CM

## 2012-06-22 DIAGNOSIS — K219 Gastro-esophageal reflux disease without esophagitis: Secondary | ICD-10-CM

## 2012-06-22 DIAGNOSIS — R7309 Other abnormal glucose: Secondary | ICD-10-CM

## 2012-06-22 DIAGNOSIS — I10 Essential (primary) hypertension: Secondary | ICD-10-CM

## 2012-06-22 DIAGNOSIS — E785 Hyperlipidemia, unspecified: Secondary | ICD-10-CM

## 2012-06-22 DIAGNOSIS — R7303 Prediabetes: Secondary | ICD-10-CM

## 2012-06-22 HISTORY — DX: Prediabetes: R73.03

## 2012-06-22 LAB — BASIC METABOLIC PANEL
BUN: 24 mg/dL — ABNORMAL HIGH (ref 6–23)
CO2: 31 mEq/L (ref 19–32)
Calcium: 9.7 mg/dL (ref 8.4–10.5)

## 2012-06-22 MED ORDER — SIMVASTATIN 40 MG PO TABS: 40.0000 mg | ORAL_TABLET | Freq: Every day | ORAL | Status: DC

## 2012-06-22 MED ORDER — METOPROLOL TARTRATE 50 MG PO TABS: 50.0000 mg | ORAL_TABLET | Freq: Two times a day (BID) | ORAL | Status: DC

## 2012-06-22 MED ORDER — HYDROCHLOROTHIAZIDE 25 MG PO TABS: 25.0000 mg | ORAL_TABLET | Freq: Every day | ORAL | Status: DC

## 2012-06-22 NOTE — Patient Instructions (Addendum)
-  Cerave Cream (comes in a tub) - use daily on hands  -start walking daily and work on eating more vegetables and less pie  -compression stockings and elevation of legs dialy  -see an eye doctor yearly  -follow up in 4 months with me

## 2012-06-22 NOTE — Progress Notes (Signed)
Chief Complaint  Patient presents with  . 3 month follow up    HPI:  Follow up:  HTN: -stable -denies:  CP, SOB, change in LE swelling, palpitaitons  CKD:  -stable  HLD: -stable  Borderline Diabetes: -per last labs, managed with lifestyle recs -however, diet is poor and no exercise - is silver sne[employer] member but not good about going -has gained 3 lbs  Dry skin on hands: -thinks reaction to gloves she uses at work  ROS: See pertinent positives and negatives per HPI.  Past Medical History  Diagnosis Date  . Hypertension   . High cholesterol   . CKD (chronic kidney disease) 03/24/2012  . GERD (gastroesophageal reflux disease) 03/24/2012  . Lower extremity edema 03/24/2012  . Borderline diabetes 06/22/2012    Family History  Problem Relation Age of Onset  . Breast cancer Mother   . Hypertension Mother   . Alzheimer's disease Mother   . Hypertension Father     History   Social History  . Marital Status: Married    Spouse Name: N/A    Number of Children: N/A  . Years of Education: N/A   Social History Main Topics  . Smoking status: Never Smoker   . Smokeless tobacco: None  . Alcohol Use: No  . Drug Use: None  . Sexually Active: None   Other Topics Concern  . None   Social History Narrative  . None    Current outpatient prescriptions:aspirin 325 MG tablet, Take 325 mg by mouth daily., Disp: , Rfl: ;  calcium gluconate 500 MG tablet, Take 500 mg by mouth daily., Disp: , Rfl: ;  famotidine (PEPCID) 20 MG tablet, Take 20 mg by mouth daily., Disp: , Rfl: ;  hydrochlorothiazide (HYDRODIURIL) 25 MG tablet, Take 1 tablet (25 mg total) by mouth daily., Disp: 90 tablet, Rfl: 3 metoprolol (LOPRESSOR) 50 MG tablet, Take 1 tablet (50 mg total) by mouth 2 (two) times daily., Disp: 90 tablet, Rfl: 3;  simvastatin (ZOCOR) 40 MG tablet, Take 1 tablet (40 mg total) by mouth at bedtime., Disp: 90 tablet, Rfl: 3  EXAM:  Filed Vitals:   06/22/12 1029  BP: 138/88  Pulse: 60   Temp: 97.7 F (36.5 C)    Body mass index is 29.32 kg/(m^2).  GENERAL: vitals reviewed and listed above, alert, oriented, appears well hydrated and in no acute distress  HEENT: atraumatic, conjunttiva clear, no obvious abnormalities on inspection of external nose and ears  NECK: no obvious masses on inspection  LUNGS: clear to auscultation bilaterally, no wheezes, rales or rhonchi, good air movement  CV: HRRR, + peripheral edema  MS: moves all extremities without noticeable abnormality  PSYCH: pleasant and cooperative, no obvious depression or anxiety  ASSESSMENT AND PLAN:  Discussed the following assessment and plan:  Borderline diabetes  Hypertension - Plan: Basic metabolic panel, metoprolol (LOPRESSOR) 50 MG tablet, hydrochlorothiazide (HYDRODIURIL) 25 MG tablet  CKD (chronic kidney disease)  GERD (gastroesophageal reflux disease)  Hyperlipidemia - Plan: simvastatin (ZOCOR) 40 MG tablet  -stressed importance of healthy lifestyle, low carb, low salt diet with lots of veggies and fruits and reg exercise - she has a goal to get a treadmill and do walking daily and work on improving diet by eating more vegetables, and cutting back on pie -cerave for hands -warning signs for worsening diabetes discussed - monitor given and goal BS discussed -labs today: FASTING BMP today -yearly eye exam -continue current medications -follow up 3-4 months  -Patient advised to  return or notify a doctor immediately if symptoms worsen or persist or new concerns arise.  Patient Instructions  -Cerave Cream (comes in a tub) - use daily on hands  -start walking daily and work on eating more vegetables and less pie  -compression stockings and elevation of legs dialy  -see an eye doctor yearly  -follow up in 4 months with me     Kriste Basque R.

## 2012-07-20 ENCOUNTER — Ambulatory Visit
Admission: RE | Admit: 2012-07-20 | Discharge: 2012-07-20 | Disposition: A | Discharge status: Home / Self Care | Payer: Medicare Other | Source: Ambulatory Visit

## 2012-07-20 DIAGNOSIS — Z1231 Encounter for screening mammogram for malignant neoplasm of breast: Secondary | ICD-10-CM

## 2012-10-26 ENCOUNTER — Ambulatory Visit (INDEPENDENT_AMBULATORY_CARE_PROVIDER_SITE_OTHER): Payer: Self-pay | Admitting: Family Medicine

## 2012-10-26 ENCOUNTER — Encounter: Payer: Self-pay | Admitting: Family Medicine

## 2012-10-26 VITALS — BP 130/80 | Temp 98.0°F | Wt 160.0 lb

## 2012-10-26 DIAGNOSIS — I10 Essential (primary) hypertension: Secondary | ICD-10-CM

## 2012-10-26 DIAGNOSIS — N189 Chronic kidney disease, unspecified: Secondary | ICD-10-CM

## 2012-10-26 DIAGNOSIS — R7303 Prediabetes: Secondary | ICD-10-CM

## 2012-10-26 DIAGNOSIS — K219 Gastro-esophageal reflux disease without esophagitis: Secondary | ICD-10-CM

## 2012-10-26 DIAGNOSIS — E785 Hyperlipidemia, unspecified: Secondary | ICD-10-CM

## 2012-10-26 DIAGNOSIS — R7309 Other abnormal glucose: Secondary | ICD-10-CM

## 2012-10-26 LAB — BASIC METABOLIC PANEL
BUN: 22 mg/dL (ref 6–23)
CO2: 30 mEq/L (ref 19–32)
Calcium: 9.9 mg/dL (ref 8.4–10.5)
Chloride: 105 mEq/L (ref 96–112)
GFR: 53.59 mL/min — ABNORMAL LOW (ref >60.00)

## 2012-10-26 LAB — MICROALBUMIN / CREATININE URINE RATIO: Microalb, Ur: 4.5 mg/dL — ABNORMAL HIGH (ref 0.0–1.9)

## 2012-10-26 LAB — LIPID PANEL
Triglycerides: 106 mg/dL (ref 0.0–149.0)
VLDL: 21.2 mg/dL (ref 0.0–40.0)

## 2012-10-26 NOTE — Progress Notes (Signed)
Chief Complaint  Patient presents with  . 4 month follow up    HPI:  Follow up:  HTN: -stable on HCTZ 25mg  qd and metoprolol 50mg  bid - has been on these for a long time -denies:  CP, SOB, change in LE swelling, palpitaitons  CKD:  -stable  HLD: -stable on zocor  GERD: -stable on pepcid  Borderline Diabetes: -managed with lifestyle recs -last visit had gained weight and was not exercising - goal set to walk daily, get treadmill, eat more veggies and cut back on pie -she did by the treadmill, has lost 3 lbs -monitor given last visit  Dry skin on hands: -thinks reaction to gloves she uses at work -advised cerave last visit  ROS: See pertinent positives and negatives per HPI.  Past Medical History  Diagnosis Date  . Hypertension   . High cholesterol   . CKD (chronic kidney disease) 03/24/2012  . GERD (gastroesophageal reflux disease) 03/24/2012  . Lower extremity edema 03/24/2012  . Borderline diabetes 06/22/2012    Family History  Problem Relation Age of Onset  . Breast cancer Mother   . Hypertension Mother   . Alzheimer's disease Mother   . Hypertension Father     History   Social History  . Marital Status: Married    Spouse Name: N/A    Number of Children: N/A  . Years of Education: N/A   Social History Main Topics  . Smoking status: Never Smoker   . Smokeless tobacco: None  . Alcohol Use: No  . Drug Use: None  . Sexually Active: None   Other Topics Concern  . None   Social History Narrative  . None    Current outpatient prescriptions:aspirin 325 MG tablet, Take 325 mg by mouth daily., Disp: , Rfl: ;  famotidine (PEPCID) 20 MG tablet, Take 20 mg by mouth daily., Disp: , Rfl: ;  hydrochlorothiazide (HYDRODIURIL) 25 MG tablet, Take 1 tablet (25 mg total) by mouth daily., Disp: 90 tablet, Rfl: 3;  metoprolol (LOPRESSOR) 50 MG tablet, Take 1 tablet (50 mg total) by mouth 2 (two) times daily., Disp: 90 tablet, Rfl: 3 simvastatin (ZOCOR) 40 MG tablet, Take  1 tablet (40 mg total) by mouth at bedtime., Disp: 90 tablet, Rfl: 3;  calcium gluconate 500 MG tablet, Take 500 mg by mouth daily., Disp: , Rfl:   EXAM:  Filed Vitals:   10/26/12 1001  BP: 130/80  Temp: 98 F (36.7 C)    Body mass index is 28.78 kg/(m^2).  GENERAL: vitals reviewed and listed above, alert, oriented, appears well hydrated and in no acute distress  HEENT: atraumatic, conjunttiva clear, no obvious abnormalities on inspection of external nose and ears  NECK: no obvious masses on inspection  LUNGS: clear to auscultation bilaterally, no wheezes, rales or rhonchi, good air movement  CV: HRRR, bilat LE edema  MS: moves all extremities without noticeable abnormality  PSYCH: pleasant and cooperative, no obvious depression or anxiety  ASSESSMENT AND PLAN:  Discussed the following assessment and plan:  Borderline diabetes - Plan: Hemoglobin A1c, Microalbumin/Creatinine Ratio, Urine  CKD (chronic kidney disease), unspecified stage  Hypertension  GERD (gastroesophageal reflux disease)  Hyperlipidemia - Plan: Lipid Panel, Basic metabolic panel  -congratulated on lifestyle changes - continue exercise and work on cutting back on carbs and sweets -NON-FASTING labs -continue current medications -Patient advised to return or notify a doctor immediately if symptoms worsen or persist or new concerns arise.  Patient Instructions  -We have ordered labs or  studies at this visit. It can take up to 1-2 weeks for results and processing. We will contact you with instructions IF your results are abnormal. Normal results will be released to your [facility]. If you have not heard from [place] or can not find your results in [facility] in 2 weeks please contact our office.  -PLEASE SIGN UP FOR MYCHART TODAY   We recommend the following healthy lifestyle measures: - eat a healthy diet consisting of lots of vegetables, fruits, beans, nuts, seeds, healthy meats such as white chicken and fish  and whole grains.  - avoid fried foods, fast food, processed foods, sodas, red meet and other fattening foods.  - get a least 150 minutes of aerobic exercise per week.   Follow up in: 3-4 months      Valerie Kassa R.

## 2012-10-26 NOTE — Patient Instructions (Signed)
-  We have ordered labs or studies at this visit. It can take up to 1-2 weeks for results and processing. We will contact you with instructions IF your results are abnormal. Normal results will be released to your MYCHART. If you have not heard from us or can not find your results in MYCHART in 2 weeks please contact our office.  -PLEASE SIGN UP FOR MYCHART TODAY   We recommend the following healthy lifestyle measures: - eat a healthy diet consisting of lots of vegetables, fruits, beans, nuts, seeds, healthy meats such as white chicken and fish and whole grains.  - avoid fried foods, fast food, processed foods, sodas, red meet and other fattening foods.  - get a least 150 minutes of aerobic exercise per week.   Follow up in: 3-4 months  

## 2012-10-28 NOTE — Progress Notes (Signed)
Quick Note:  Called and spoke with pt and pt is aware. ______ 

## 2012-10-31 ENCOUNTER — Other Ambulatory Visit: Payer: Self-pay | Admitting: Family Medicine

## 2012-10-31 DIAGNOSIS — E876 Hypokalemia: Secondary | ICD-10-CM

## 2012-11-09 ENCOUNTER — Other Ambulatory Visit (INDEPENDENT_AMBULATORY_CARE_PROVIDER_SITE_OTHER): Payer: Medicare Other

## 2012-11-09 DIAGNOSIS — E876 Hypokalemia: Secondary | ICD-10-CM

## 2012-11-09 LAB — BASIC METABOLIC PANEL
CO2: 29 mEq/L (ref 19–32)
Chloride: 101 mEq/L (ref 96–112)
Creatinine, Ser: 1 mg/dL (ref 0.4–1.2)

## 2012-11-10 NOTE — Progress Notes (Signed)
Quick Note:  Called and spoke with pt and pt is aware. Pt states she takes a daily potassium and has been trying to increase her potassium rich foods. ______

## 2013-01-26 ENCOUNTER — Ambulatory Visit: Payer: Medicare Other | Admitting: Family Medicine

## 2013-01-26 ENCOUNTER — Other Ambulatory Visit: Payer: Self-pay

## 2013-02-02 ENCOUNTER — Ambulatory Visit (INDEPENDENT_AMBULATORY_CARE_PROVIDER_SITE_OTHER): Payer: Medicare Other | Admitting: Family Medicine

## 2013-02-02 ENCOUNTER — Encounter: Payer: Self-pay | Admitting: Family Medicine

## 2013-02-02 VITALS — BP 102/80 | Temp 97.7°F | Wt 161.0 lb

## 2013-02-02 DIAGNOSIS — R609 Edema, unspecified: Secondary | ICD-10-CM

## 2013-02-02 DIAGNOSIS — K219 Gastro-esophageal reflux disease without esophagitis: Secondary | ICD-10-CM

## 2013-02-02 DIAGNOSIS — N189 Chronic kidney disease, unspecified: Secondary | ICD-10-CM

## 2013-02-02 DIAGNOSIS — E785 Hyperlipidemia, unspecified: Secondary | ICD-10-CM

## 2013-02-02 DIAGNOSIS — R600 Localized edema: Secondary | ICD-10-CM

## 2013-02-02 DIAGNOSIS — N058 Unspecified nephritic syndrome with other morphologic changes: Secondary | ICD-10-CM

## 2013-02-02 DIAGNOSIS — L989 Disorder of the skin and subcutaneous tissue, unspecified: Secondary | ICD-10-CM

## 2013-02-02 DIAGNOSIS — E1129 Type 2 diabetes mellitus with other diabetic kidney complication: Secondary | ICD-10-CM

## 2013-02-02 DIAGNOSIS — I10 Essential (primary) hypertension: Secondary | ICD-10-CM

## 2013-02-02 LAB — MICROALBUMIN / CREATININE URINE RATIO
Creatinine,U: 21.6 mg/dL
Microalb, Ur: 0.6 mg/dL (ref 0.0–1.9)

## 2013-02-02 LAB — LIPID PANEL
Cholesterol: 144 mg/dL (ref 0–200)
LDL Cholesterol: 80 mg/dL (ref 0–99)
Triglycerides: 113 mg/dL (ref 0.0–149.0)

## 2013-02-02 LAB — BASIC METABOLIC PANEL
BUN: 21 mg/dL (ref 6–23)
Calcium: 9.5 mg/dL (ref 8.4–10.5)
Chloride: 101 mEq/L (ref 96–112)
Creatinine, Ser: 1.1 mg/dL (ref 0.4–1.2)

## 2013-02-02 LAB — HEMOGLOBIN A1C: Hgb A1c MFr Bld: 6.4 % (ref 4.6–6.5)

## 2013-02-02 NOTE — Patient Instructions (Signed)
-  get a eye exam with an eye doctor and send [place] the report  -We have ordered labs or studies at this visit. It can take up to 1-2 weeks for results and processing. We will contact you with instructions IF your results are abnormal. Normal results will be released to your [facility]. If you have not heard from [place] or can not find your results in [facility] in 2 weeks please contact our office.  We recommend the following healthy lifestyle measures: - eat a healthy diet consisting of lots of vegetables, fruits, beans, nuts, seeds, healthy meats such as white chicken and fish and whole grains.  - avoid fried foods, fast food, processed foods, sodas, red meet and other fattening foods.  - get a least 150 minutes of aerobic exercise per week.

## 2013-02-02 NOTE — Progress Notes (Signed)
Chief Complaint  Patient presents with  . Follow-up    low potassium    HPI:  Follow up:  Skin Lesion: -R side of nose -first noticed about 4 months ago - thought it was a pimple -not healing -does not hurt or itch   HTN: -stable -denies: -on asa, hctz, bb -mildly low potatssium at times - stable -hx benign tachy remotely and on metoprolol once daily -denies:CP, SOB, swelling, palpitations  HLD:  -stable -on statin  CKD: -stable  GERD: -stable on pepcid  Borderline diabetes: -working on lifestyle changes -had been using treadmill last visit and had lost a few lbs -now reports: walking every day for 1 hour -acei: not taking  Flu Vaccine: refused  ROS: See pertinent positives and negatives per HPI.  Past Medical History  Diagnosis Date  . Hypertension   . High cholesterol   . CKD (chronic kidney disease) 03/24/2012  . GERD (gastroesophageal reflux disease) 03/24/2012  . Lower extremity edema 03/24/2012  . Borderline diabetes 06/22/2012    Past Surgical History  Procedure Laterality Date  . Abdominal hysterectomy  1987    Family History  Problem Relation Age of Onset  . Breast cancer Mother   . Hypertension Mother   . Alzheimer's disease Mother   . Hypertension Father     History   Social History  . Marital Status: Married    Spouse Name: N/A    Number of Children: N/A  . Years of Education: N/A   Social History Main Topics  . Smoking status: Never Smoker   . Smokeless tobacco: None  . Alcohol Use: No  . Drug Use: None  . Sexual Activity: None   Other Topics Concern  . None   Social History Narrative  . None    Current outpatient prescriptions:aspirin 325 MG tablet, Take 325 mg by mouth daily., Disp: , Rfl: ;  calcium gluconate 500 MG tablet, Take 500 mg by mouth daily., Disp: , Rfl: ;  famotidine (PEPCID) 20 MG tablet, Take 20 mg by mouth daily., Disp: , Rfl: ;  hydrochlorothiazide (HYDRODIURIL) 25 MG tablet, Take 1 tablet (25 mg total)  by mouth daily., Disp: 90 tablet, Rfl: 3 metoprolol (LOPRESSOR) 50 MG tablet, Take 1 tablet (50 mg total) by mouth 2 (two) times daily., Disp: 90 tablet, Rfl: 3;  Potassium Gluconate 595 MG CAPS, Take by mouth daily., Disp: , Rfl: ;  simvastatin (ZOCOR) 40 MG tablet, Take 1 tablet (40 mg total) by mouth at bedtime., Disp: 90 tablet, Rfl: 3  EXAM:  Filed Vitals:   02/02/13 1258  BP: 102/80  Temp: 97.7 F (36.5 C)    Body mass index is 28.96 kg/(m^2).  GENERAL: vitals reviewed and listed above, alert, oriented, appears well hydrated and in no acute distress  HEENT: atraumatic, conjunttiva clear, no obvious abnormalities on inspection of external nose and ears except where noted in skin  SKIN: ulcerated lesion R side of nose ~ 5mm in diameter  NECK: no obvious masses on inspection  LUNGS: clear to auscultation bilaterally, no wheezes, rales or rhonchi, good air movement  CV: HRRR, no peripheral edema  MS: moves all extremities without noticeable abnormality  PSYCH: pleasant and cooperative, no obvious depression or anxiety  Foot Exam: see form  ASSESSMENT AND PLAN:  Discussed the following assessment and plan:  Skin lesion of face - Plan: Ambulatory referral to Dermatology  CKD (chronic kidney disease)  Hypertension - Plan: Basic metabolic panel  GERD (gastroesophageal reflux disease)  Hyperlipidemia -  Plan: Lipid Panel  Lower extremity edema  Type 2 diabetes mellitus with renal manifestations, controlled - Plan: Hemoglobin A1c, Microalbumin/Creatinine Ratio, Urine  -referred for skin lesion - advised possible skin cancer and importance of seeing derm for removal -discussed adding acei - she prefers to check labs then decide - may add low dose acei -congratulated on exercise -NON-FASTING labs -Patient advised to return or notify a doctor immediately if symptoms worsen or persist or new concerns arise. -follow up 3-4 months  Patient Instructions  -get a eye exam  with an eye doctor and send [place] the report  -We have ordered labs or studies at this visit. It can take up to 1-2 weeks for results and processing. We will contact you with instructions IF your results are abnormal. Normal results will be released to your [facility]. If you have not heard from [place] or can not find your results in [facility] in 2 weeks please contact our office.  We recommend the following healthy lifestyle measures: - eat a healthy diet consisting of lots of vegetables, fruits, beans, nuts, seeds, healthy meats such as white chicken and fish and whole grains.  - avoid fried foods, fast food, processed foods, sodas, red meet and other fattening foods.  - get a least 150 minutes of aerobic exercise per week.            Kriste Basque R.

## 2013-02-02 NOTE — Progress Notes (Signed)
Pre visit review using our clinic review tool, if applicable. No additional management support is needed unless otherwise documented below in the visit note. 

## 2013-05-11 ENCOUNTER — Telehealth: Payer: Self-pay | Admitting: Family Medicine

## 2013-05-11 DIAGNOSIS — I10 Essential (primary) hypertension: Secondary | ICD-10-CM

## 2013-05-11 DIAGNOSIS — E785 Hyperlipidemia, unspecified: Secondary | ICD-10-CM

## 2013-05-11 MED ORDER — SIMVASTATIN 40 MG PO TABS: 40.0000 mg | ORAL_TABLET | Freq: Every day | ORAL | Status: DC

## 2013-05-11 MED ORDER — HYDROCHLOROTHIAZIDE 25 MG PO TABS: 25.0000 mg | ORAL_TABLET | Freq: Every day | ORAL | Status: DC

## 2013-05-11 NOTE — Telephone Encounter (Signed)
Called and spoke with pt's daughter and advised rx would be ready for pick up today.  Advised that pt is due for follow up visit soon.  Pt's daughter verbalized understanding.

## 2013-05-11 NOTE — Telephone Encounter (Signed)
Pt would like written rx for simvastatin 40 mg #90 w.refills and hctz 25 mg #90 w/refills. Pt would like to shop around for cheapest price at [facility]

## 2013-07-06 ENCOUNTER — Encounter: Payer: Self-pay | Admitting: Family Medicine

## 2013-07-06 ENCOUNTER — Ambulatory Visit (INDEPENDENT_AMBULATORY_CARE_PROVIDER_SITE_OTHER): Payer: Medicare HMO | Admitting: Family Medicine

## 2013-07-06 ENCOUNTER — Other Ambulatory Visit: Payer: Self-pay

## 2013-07-06 VITALS — BP 132/84 | Temp 98.8°F | Wt 159.0 lb

## 2013-07-06 DIAGNOSIS — E785 Hyperlipidemia, unspecified: Secondary | ICD-10-CM

## 2013-07-06 DIAGNOSIS — K219 Gastro-esophageal reflux disease without esophagitis: Secondary | ICD-10-CM

## 2013-07-06 DIAGNOSIS — I10 Essential (primary) hypertension: Secondary | ICD-10-CM

## 2013-07-06 DIAGNOSIS — R609 Edema, unspecified: Secondary | ICD-10-CM

## 2013-07-06 DIAGNOSIS — N189 Chronic kidney disease, unspecified: Secondary | ICD-10-CM

## 2013-07-06 DIAGNOSIS — Z1231 Encounter for screening mammogram for malignant neoplasm of breast: Secondary | ICD-10-CM

## 2013-07-06 DIAGNOSIS — R600 Localized edema: Secondary | ICD-10-CM

## 2013-07-06 NOTE — Patient Instructions (Signed)
  We recommend the following healthy lifestyle measures: - eat a healthy diet consisting of lots of vegetables, fruits, beans, nuts, seeds, healthy meats such as white chicken and fish and whole grains.  - avoid fried foods, fast food, processed foods, sodas, red meet and other fattening foods.  - get a least 150 minutes of aerobic exercise per week.   Follow up in: 3 months for physical

## 2013-07-06 NOTE — Progress Notes (Signed)
No chief complaint on file.   HPI:  HTN: -stable  HLD: -stable  GERD: Stable  ROS: See pertinent positives and negatives per HPI.  Past Medical History  Diagnosis Date  . Hypertension   . High cholesterol   . CKD (chronic kidney disease) 03/24/2012  . GERD (gastroesophageal reflux disease) 03/24/2012  . Lower extremity edema 03/24/2012  . Borderline diabetes 06/22/2012    Past Surgical History  Procedure Laterality Date  . Abdominal hysterectomy  1987    Family History  Problem Relation Age of Onset  . Breast cancer Mother   . Hypertension Mother   . Alzheimer's disease Mother   . Hypertension Father     History   Social History  . Marital Status: Married    Spouse Name: N/A    Number of Children: N/A  . Years of Education: N/A   Social History Main Topics  . Smoking status: Never Smoker   . Smokeless tobacco: None  . Alcohol Use: No  . Drug Use: None  . Sexual Activity: None   Other Topics Concern  . None   Social History Narrative  . None    Current outpatient prescriptions:aspirin 325 MG tablet, Take 325 mg by mouth daily., Disp: , Rfl: ;  calcium gluconate 500 MG tablet, Take 500 mg by mouth daily., Disp: , Rfl: ;  famotidine (PEPCID) 20 MG tablet, Take 20 mg by mouth daily., Disp: , Rfl: ;  hydrochlorothiazide (HYDRODIURIL) 25 MG tablet, Take 1 tablet (25 mg total) by mouth daily., Disp: 90 tablet, Rfl: 1 metoprolol (LOPRESSOR) 50 MG tablet, Take 1 tablet (50 mg total) by mouth 2 (two) times daily., Disp: 90 tablet, Rfl: 3;  Potassium Gluconate 595 MG CAPS, Take by mouth daily., Disp: , Rfl: ;  simvastatin (ZOCOR) 40 MG tablet, Take 1 tablet (40 mg total) by mouth at bedtime., Disp: 90 tablet, Rfl: 1  EXAM:  Filed Vitals:   07/06/13 1410  BP: 132/84  Temp: 98.8 F (37.1 C)    Body mass index is 28.6 kg/(m^2).  GENERAL: vitals reviewed and listed above, alert, oriented, appears well hydrated and in no acute distress  HEENT: atraumatic,  conjunttiva clear, no obvious abnormalities on inspection of external nose and ears  NECK: no obvious masses on inspection  LUNGS: clear to auscultation bilaterally, no wheezes, rales or rhonchi, good air movement  CV: HRRR, no peripheral edema  MS: moves all extremities without noticeable abnormality  PSYCH: pleasant and cooperative, no obvious depression or anxiety  ASSESSMENT AND PLAN:  Discussed the following assessment and plan:  CKD (chronic kidney disease)  Hypertension  GERD (gastroesophageal reflux disease)  Hyperlipidemia  Lower extremity edema  -stable -doing well -continue current medications -Patient advised to return or notify a doctor immediately if symptoms worsen or persist or new concerns arise.  Patient Instructions   We recommend the following healthy lifestyle measures: - eat a healthy diet consisting of lots of vegetables, fruits, beans, nuts, seeds, healthy meats such as white chicken and fish and whole grains.  - avoid fried foods, fast food, processed foods, sodas, red meet and other fattening foods.  - get a least 150 minutes of aerobic exercise per week.   Follow up in: 3 months for physical      Lucretia Kern  Pre visit review using our clinic review tool, if applicable. No additional management support is needed unless otherwise documented below in the visit note.

## 2013-07-07 ENCOUNTER — Telehealth: Payer: Self-pay | Admitting: Family Medicine

## 2013-07-07 NOTE — Telephone Encounter (Signed)
Relevant patient education assigned to patient using Emmi. ° °

## 2013-07-21 ENCOUNTER — Ambulatory Visit: Payer: Medicare HMO

## 2013-11-08 ENCOUNTER — Encounter: Payer: Self-pay | Admitting: Family Medicine

## 2013-11-08 ENCOUNTER — Ambulatory Visit (INDEPENDENT_AMBULATORY_CARE_PROVIDER_SITE_OTHER): Payer: Medicare HMO | Admitting: Family Medicine

## 2013-11-08 VITALS — BP 120/78 | HR 53 | Temp 97.6°F | Ht 62.25 in | Wt 160.0 lb

## 2013-11-08 DIAGNOSIS — E1129 Type 2 diabetes mellitus with other diabetic kidney complication: Secondary | ICD-10-CM

## 2013-11-08 DIAGNOSIS — R739 Hyperglycemia, unspecified: Secondary | ICD-10-CM

## 2013-11-08 DIAGNOSIS — R609 Edema, unspecified: Secondary | ICD-10-CM

## 2013-11-08 DIAGNOSIS — I10 Essential (primary) hypertension: Secondary | ICD-10-CM

## 2013-11-08 DIAGNOSIS — K219 Gastro-esophageal reflux disease without esophagitis: Secondary | ICD-10-CM

## 2013-11-08 DIAGNOSIS — Z1211 Encounter for screening for malignant neoplasm of colon: Secondary | ICD-10-CM

## 2013-11-08 DIAGNOSIS — N189 Chronic kidney disease, unspecified: Secondary | ICD-10-CM

## 2013-11-08 DIAGNOSIS — E785 Hyperlipidemia, unspecified: Secondary | ICD-10-CM

## 2013-11-08 DIAGNOSIS — Z1239 Encounter for other screening for malignant neoplasm of breast: Secondary | ICD-10-CM

## 2013-11-08 DIAGNOSIS — E1122 Type 2 diabetes mellitus with diabetic chronic kidney disease: Secondary | ICD-10-CM

## 2013-11-08 DIAGNOSIS — F09 Unspecified mental disorder due to known physiological condition: Secondary | ICD-10-CM

## 2013-11-08 DIAGNOSIS — R7309 Other abnormal glucose: Secondary | ICD-10-CM

## 2013-11-08 DIAGNOSIS — R600 Localized edema: Secondary | ICD-10-CM

## 2013-11-08 DIAGNOSIS — Z0000 Encounter for general adult medical examination without abnormal findings: Secondary | ICD-10-CM

## 2013-11-08 LAB — HEMOGLOBIN A1C: HEMOGLOBIN A1C: 6.4 % (ref 4.6–6.5)

## 2013-11-08 LAB — LIPID PANEL
CHOLESTEROL: 148 mg/dL (ref 0–200)
HDL: 38 mg/dL — ABNORMAL LOW (ref >39.00)
LDL Cholesterol: 89 mg/dL (ref 0–99)
NonHDL: 110
Total CHOL/HDL Ratio: 4
Triglycerides: 104 mg/dL (ref 0.0–149.0)
VLDL: 20.8 mg/dL (ref 0.0–40.0)

## 2013-11-08 LAB — BASIC METABOLIC PANEL
BUN: 21 mg/dL (ref 6–23)
CALCIUM: 9.5 mg/dL (ref 8.4–10.5)
CO2: 29 mEq/L (ref 19–32)
CREATININE: 0.9 mg/dL (ref 0.4–1.2)
Chloride: 101 mEq/L (ref 96–112)
GFR: 62.82 mL/min (ref >60.00)
Glucose, Bld: 103 mg/dL — ABNORMAL HIGH (ref 70–99)
Potassium: 3.1 mEq/L — ABNORMAL LOW (ref 3.5–5.1)
Sodium: 138 mEq/L (ref 135–145)

## 2013-11-08 LAB — MICROALBUMIN / CREATININE URINE RATIO
CREATININE, U: 23.5 mg/dL
MICROALB UR: 1 mg/dL (ref 0.0–1.9)
Microalb Creat Ratio: 4.3 mg/g (ref 0.0–30.0)

## 2013-11-08 NOTE — Progress Notes (Signed)
Pre visit review using our clinic review tool, if applicable. No additional management support is needed unless otherwise documented below in the visit note. 

## 2013-11-08 NOTE — Progress Notes (Signed)
Medicare Annual Preventive Care Visit  (initial annual wellness or annual wellness exam)  Concerns and/or follow up today:  HTN:  -stable  -denies:  -on asa, hctz, bb  -mildly low potatssium at times - stable  -hx benign tachy remotely and on metoprolol once daily  -denies:CP, SOB, change in chronic LE swelling, palpitations   HLD:  -stable  -on statin   CKD:  -stable   GERD:  -stable on pepcid   Borderline diabetes:  -working on lifestyle changes  -had been using treadmill and had lost a few lbs  -now reports: walking 3 times per week for  -acei: not taking  ROS: negative for report of fevers, unintentional weight loss, vision changes, vision loss, hearing loss or change, chest pain, sob, hemoptysis, melena, hematochezia, hematuria, genital discharge or lesions, falls, bleeding or bruising, loc, thoughts of suicide or self harm, memory loss  1.) Patient-completed health risk assessment  - completed and reviewed, see scanned documentation  2.) Review of Medical History: -PMH, PSH, Family History and current specialty and care providers reviewed and updated and listed below  - see scanned in document in chart and below  Past Medical History  Diagnosis Date  . Hypertension   . High cholesterol   . CKD (chronic kidney disease) 03/24/2012  . GERD (gastroesophageal reflux disease) 03/24/2012  . Lower extremity edema 03/24/2012  . Borderline diabetes 06/22/2012    Past Surgical History  Procedure Laterality Date  . Abdominal hysterectomy  1987    History   Social History  . Marital Status: Married    Spouse Name: N/A    Number of Children: N/A  . Years of Education: N/A   Occupational History  . Not on file.   Social History Main Topics  . Smoking status: Never Smoker   . Smokeless tobacco: Not on file  . Alcohol Use: No  . Drug Use: Not on file  . Sexual Activity: Not on file   Other Topics Concern  . Not on file   Social History Narrative  . No narrative  on file    The patient has a family history of  3.) Review of functional ability and level of safety:  Any difficulty hearing?  NO  History of falling? NO  Any trouble with IADLs - using a phone, using transportation, grocery shopping, preparing meals, doing housework, doing laundry, taking medications and managing money? NO  Advance Directives? NO - working on living will  See summary of recommendations in Patient Instructions below.  4.) Physical Exam Filed Vitals:   11/08/13 0823  BP: 120/78  Pulse: 53  Temp: 97.6 F (36.4 C)   Estimated body mass index is 29.04 kg/(m^2) as calculated from the following:   Height as of this encounter: 5' 2.25" (1.581 m).   Weight as of this encounter: 160 lb (72.576 kg).  EKG (optional): deferred  General: alert, appear well hydrated and in no acute distress  HEENT: visual acuity grossly intact  CV: HRRR  Lungs: CTA bilaterally  Psych: pleasant and cooperative, no obvious depression or anxiety  Mini Cog: 1. Patient instructed to listen carefully and repeat the following: 1/3 words correctly Apple Watch    Pen  2. Clock drawing test was administered: ABNORMAL  Scoring:   Patient Score: POSITIVE  See patient instructions for recommendations.  Education and counseling regarding the above review of health provided with a plan for the following: -see scanned patient completed form for further details -fall prevention strategies discussed  -  healthy lifestyle discussed -importance and resources for completing advanced directives discussed -see patient instructions below for any other recommendations provided  4)The following written screening schedule of preventive measures were reviewed with assessment and plan made per below, orders and patient instructions:      AAA screening: n/a     Alcohol screening: done     Obesity Screening and counseling: done     STI screening: declined     Tobacco Screening: done        Pneumococcal (PPSV23 -one dose after 64, one before if risk factors), influenza yearly and hepatitis B vaccines (if high risk - end stage renal disease, IV drugs, homosexual men, live in home for mentally retarded, hemophilia receiving factors) ASSESSMENT/PLAN: done      Screening mammograph (yearly if >40) ASSESSMENT/PLAN: needs order for this for insurance       Screening Pap smear/pelvic exam (q2 years) ASSESSMENT/PLAN: n/a      Prostate cancer screening ASSESSMENT/PLAN: N/A      Colorectal cancer screening (FOBT yearly or flex sig q4y or colonoscopy q10y or barium enema q4y) ASSESSMENT/PLAN: reports received letter that need colonoscopy due to polyp, needs referral      Diabetes outpatient self-management training services ASSESSMENT/PLAN: done      Bone mass measurements(covered q2y if indicated - estrogen def, osteoporosis, hyperparathyroid, vertebral abnormalities, osteoporosis or steroids) ASSESSMENT/PLAN: declined      Screening for glaucoma(q1y if high risk - diabetes, FH, AA and > 50 or hispanic and > 65) ASSESSMENT/PLAN: advised yearly eye exam      Medical nutritional therapy for individuals with diabetes or renal disease ASSESSMENT/PLAN: done today      Cardiovascular screening blood tests (lipids q5y) ASSESSMENT/PLAN: today      Diabetes screening tests ASSESSMENT/PLAN: today   7.) Summary: -risk factors and conditions per above assessment were discussed and treatment, recommendations and referrals were offered per documentation above and orders and patient instructions.  Screening for breast cancer - Plan: MM Digital Screening  Colon cancer screening - Plan: Ambulatory referral to Gastroenterology  Medicare annual wellness visit, subsequent  Cognitive dysfunction  Essential hypertension  Gastroesophageal reflux disease, esophagitis presence not specified  Hyperlipidemia - Plan: Lipid Panel  Bilateral edema of lower extremity  Hyperglycemia  Type  2 diabetes mellitus with diabetic chronic kidney disease - Plan: Hemoglobin C7E, Basic metabolic panel, Microalbumin/Creatinine Ratio, Urine  -Foot Exam today -FASTING labs -advised mammogram -advised eye exam -diet and exercise for diabetes -offered prevnar 13 - declined -discussed the abnormal mini cog and potential etiologies - she reports stable and chronic memorry issues her whole life and is very functional, she refused any further evaluation for this despite my recommendations   Patient Instructions  -1000 IU of Vit D3 daily; calcium 1200mg  total from diet + or - supplement  -please complete your living will/advanced directives and bring copy to our office  -We placed a referral for you as discussed for the mammogram and the colonoscopy. It usually takes about 1-2 weeks to process and schedule this referral. If you have not heard from [place] regarding this appointment in 2 weeks please contact our office.  -schedule diabetic eye exam  -wear compression socks daily  -We have ordered labs or studies at this visit. It can take up to 1-2 weeks for results and processing. We will contact you with instructions IF your results are abnormal. Normal results will be released to your [facility]. If you have not heard from [place] or can not find  your results in [facility] in 2 weeks please contact our office.  -We recommend the following healthy lifestyle measures: - eat a healthy diet consisting of lots of vegetables, fruits, beans, nuts, seeds, healthy meats such as white chicken and fish and whole grains.  - avoid fried foods, fast food, processed foods, sodas, red meet and other fattening foods.  - get a least 150 minutes of aerobic exercise per week.

## 2013-11-08 NOTE — Patient Instructions (Addendum)
-  1000 IU of Vit D3 daily; calcium 1200mg  total from diet + or - supplement  -please complete your living will/advanced directives and bring copy to our office  -We placed a referral for you as discussed for the mammogram and the colonoscopy. It usually takes about 1-2 weeks to process and schedule this referral. If you have not heard from [place] regarding this appointment in 2 weeks please contact our office.  -schedule diabetic eye exam  -wear compression socks daily  -We have ordered labs or studies at this visit. It can take up to 1-2 weeks for results and processing. We will contact you with instructions IF your results are abnormal. Normal results will be released to your [facility]. If you have not heard from [place] or can not find your results in [facility] in 2 weeks please contact our office.  -We recommend the following healthy lifestyle measures: - eat a healthy diet consisting of lots of vegetables, fruits, beans, nuts, seeds, healthy meats such as white chicken and fish and whole grains.  - avoid fried foods, fast food, processed foods, sodas, red meet and other fattening foods.  - get a least 150 minutes of aerobic exercise per week.

## 2013-11-13 ENCOUNTER — Other Ambulatory Visit: Payer: Self-pay | Admitting: Family Medicine

## 2013-11-14 ENCOUNTER — Telehealth: Payer: Self-pay | Admitting: *Deleted

## 2013-11-14 NOTE — Telephone Encounter (Signed)
Valerie Nguyen, pts daughter called and she was given all lab results and I informed her this was sent to the pts Mychart acct as well.

## 2014-01-25 ENCOUNTER — Telehealth: Payer: Self-pay | Admitting: Family Medicine

## 2014-01-25 DIAGNOSIS — I10 Essential (primary) hypertension: Secondary | ICD-10-CM

## 2014-01-25 MED ORDER — METOPROLOL TARTRATE 50 MG PO TABS: 50.0000 mg | ORAL_TABLET | Freq: Two times a day (BID) | ORAL | Status: DC

## 2014-01-25 NOTE — Telephone Encounter (Signed)
Rx done. 

## 2014-01-25 NOTE — Telephone Encounter (Signed)
Pt request refill of the following: metoprolol (LOPRESSOR) 50 MG tablet   Pham[facility]:  [facility]

## 2014-02-08 ENCOUNTER — Encounter: Payer: Self-pay | Admitting: Family Medicine

## 2014-02-08 ENCOUNTER — Telehealth: Payer: Self-pay | Admitting: *Deleted

## 2014-02-08 ENCOUNTER — Ambulatory Visit (INDEPENDENT_AMBULATORY_CARE_PROVIDER_SITE_OTHER): Payer: Medicare HMO | Admitting: Family Medicine

## 2014-02-08 VITALS — BP 130/80 | HR 60 | Temp 97.3°F | Ht 62.25 in | Wt 159.5 lb

## 2014-02-08 DIAGNOSIS — R739 Hyperglycemia, unspecified: Secondary | ICD-10-CM

## 2014-02-08 DIAGNOSIS — E785 Hyperlipidemia, unspecified: Secondary | ICD-10-CM

## 2014-02-08 DIAGNOSIS — E876 Hypokalemia: Secondary | ICD-10-CM

## 2014-02-08 DIAGNOSIS — R600 Localized edema: Secondary | ICD-10-CM

## 2014-02-08 DIAGNOSIS — I10 Essential (primary) hypertension: Secondary | ICD-10-CM

## 2014-02-08 DIAGNOSIS — R000 Tachycardia, unspecified: Secondary | ICD-10-CM

## 2014-02-08 LAB — BASIC METABOLIC PANEL
BUN: 23 mg/dL (ref 6–23)
CALCIUM: 9.5 mg/dL (ref 8.4–10.5)
CO2: 29 mEq/L (ref 19–32)
CREATININE: 1 mg/dL (ref 0.4–1.2)
Chloride: 102 mEq/L (ref 96–112)
GFR: 55.18 mL/min — AB (ref >60.00)
Glucose, Bld: 100 mg/dL — ABNORMAL HIGH (ref 70–99)
POTASSIUM: 3.2 meq/L — AB (ref 3.5–5.1)
Sodium: 139 mEq/L (ref 135–145)

## 2014-02-08 MED ORDER — METOPROLOL TARTRATE 25 MG PO TABS: 25.0000 mg | ORAL_TABLET | Freq: Every day | ORAL | Status: DC

## 2014-02-08 NOTE — Progress Notes (Signed)
HPI:  HTN/Hypokalemia:  -stable  -denies: CP, dizziness, poor energy, palpitations -on asa, hctz for LE edema and HTN, bb for tachy and HTN -mildly low potatssium at times - stable  -hx benign tachy remotely and on metoprolol once daily  -denies:CP, SOB, change in chronic LE swelling, palpitations   HLD:  -stable  -on statin   CKD:  -stable   GERD:  -stable on pepcid   Borderline diabetes:  -working on lifestyle changes  -had been using treadmill and had lost a few lbs  -now reports: walking daily, working on diet -acei: not taking -denies: polyuria, polydipsia, wounds on feet, neuropathy  ROS: See pertinent positives and negatives per HPI.  Past Medical History  Diagnosis Date  . Hypertension   . High cholesterol   . CKD (chronic kidney disease) 03/24/2012  . GERD (gastroesophageal reflux disease) 03/24/2012  . Lower extremity edema 03/24/2012  . Borderline diabetes 06/22/2012    Past Surgical History  Procedure Laterality Date  . Abdominal hysterectomy  1987    Family History  Problem Relation Age of Onset  . Breast cancer Mother   . Hypertension Mother   . Alzheimer's disease Mother   . Hypertension Father     History   Social History  . Marital Status: Married    Spouse Name: N/A    Number of Children: N/A  . Years of Education: N/A   Social History Main Topics  . Smoking status: Never Smoker   . Smokeless tobacco: None  . Alcohol Use: No  . Drug Use: None  . Sexual Activity: None   Other Topics Concern  . None   Social History Narrative    Current outpatient prescriptions: aspirin 81 MG tablet, Take 81 mg by mouth daily., Disp: , Rfl: ;  famotidine (PEPCID) 20 MG tablet, Take 20 mg by mouth daily., Disp: , Rfl: ;  hydrochlorothiazide (HYDRODIURIL) 25 MG tablet, TAKE 1 TABLET BY MOUTH DAILY, Disp: 90 tablet, Rfl: 1;  metoprolol (LOPRESSOR) 25 MG tablet, Take 1 tablet (25 mg total) by mouth daily., Disp: 90 tablet, Rfl: 3 Potassium  Gluconate 595 MG CAPS, Take by mouth daily., Disp: , Rfl: ;  simvastatin (ZOCOR) 40 MG tablet, TAKE 1 TABLET BY MOUTH ATBED TIME, Disp: 90 tablet, Rfl: 1  EXAM:  Filed Vitals:   02/08/14 1004  BP: 130/80  Pulse: 60  Temp: 97.3 F (36.3 C)    Body mass index is 28.94 kg/(m^2).  GENERAL: vitals reviewed and listed above, alert, oriented, appears well hydrated and in no acute distress  HEENT: atraumatic, conjunttiva clear, no obvious abnormalities on inspection of external nose and ears  NECK: no obvious masses on inspection  LUNGS: clear to auscultation bilaterally, no wheezes, rales or rhonchi, good air movement  CV: HRRR, no peripheral edema  MS: moves all extremities without noticeable abnormality  PSYCH: pleasant and cooperative, no obvious depression or anxiety  ASSESSMENT AND PLAN:  Discussed the following assessment and plan:  Essential hypertension - Plan: metoprolol (LOPRESSOR) 25 MG tablet, Basic metabolic panel  Hyperlipidemia  Bilateral edema of lower extremity  Hyperglycemia - Plan: Basic metabolic panel  Tachycardia - Plan: metoprolol (LOPRESSOR) 25 MG tablet  Hypokalemia - Plan: Basic metabolic panel  -FASTING labs -decrease metoprolol a little, she is only taking 1 per day of the metoprolol but opted to decrease this and add lisinopril to help the potassium -Patient advised to return or notify a doctor immediately if symptoms worsen or persist or new concerns arise.  Patient Instructions  BEFORE YOU LEAVE: -labs -schedule follow up in 1 month  -schedule mammogram  -decrease metoprolol to 25 mg daily  -we may need to change your medications further if the potassium is still low as the hydrochlorothiazide can cause this     Colin Benton R.

## 2014-02-08 NOTE — Progress Notes (Signed)
Pre visit review using our clinic review tool, if applicable. No additional management support is needed unless otherwise documented below in the visit note. 

## 2014-02-08 NOTE — Patient Instructions (Addendum)
BEFORE YOU LEAVE: -labs -schedule follow up in 1 month  -schedule mammogram  -decrease metoprolol to 25 mg daily  -we may need to change your medications further if the potassium is still low as the hydrochlorothiazide can cause this

## 2014-02-08 NOTE — Telephone Encounter (Signed)
I spoke with Darnelle Maffucci the pharmacist at [facility] and advised him per Dr Maudie Mercury the pts Metoprolol was decreased to 25mg  as of today's office visit.

## 2014-02-09 ENCOUNTER — Other Ambulatory Visit: Payer: Self-pay | Admitting: Family Medicine

## 2014-02-09 MED ORDER — LISINOPRIL-HYDROCHLOROTHIAZIDE 10-12.5 MG PO TABS: 1.0000 | ORAL_TABLET | Freq: Every day | ORAL | Status: DC

## 2014-03-13 ENCOUNTER — Ambulatory Visit: Payer: Medicare HMO | Admitting: Family Medicine

## 2014-03-14 ENCOUNTER — Encounter: Payer: Self-pay | Admitting: Family Medicine

## 2014-03-14 ENCOUNTER — Ambulatory Visit (INDEPENDENT_AMBULATORY_CARE_PROVIDER_SITE_OTHER): Payer: Medicare HMO | Admitting: Family Medicine

## 2014-03-14 VITALS — BP 140/88 | HR 58 | Temp 97.4°F | Ht 62.25 in | Wt 163.3 lb

## 2014-03-14 DIAGNOSIS — R0600 Dyspnea, unspecified: Secondary | ICD-10-CM

## 2014-03-14 DIAGNOSIS — N189 Chronic kidney disease, unspecified: Secondary | ICD-10-CM

## 2014-03-14 DIAGNOSIS — I10 Essential (primary) hypertension: Secondary | ICD-10-CM

## 2014-03-14 DIAGNOSIS — E785 Hyperlipidemia, unspecified: Secondary | ICD-10-CM

## 2014-03-14 DIAGNOSIS — R739 Hyperglycemia, unspecified: Secondary | ICD-10-CM

## 2014-03-14 DIAGNOSIS — R600 Localized edema: Secondary | ICD-10-CM

## 2014-03-14 DIAGNOSIS — K219 Gastro-esophageal reflux disease without esophagitis: Secondary | ICD-10-CM

## 2014-03-14 LAB — BASIC METABOLIC PANEL
BUN: 25 mg/dL — ABNORMAL HIGH (ref 6–23)
CALCIUM: 9.5 mg/dL (ref 8.4–10.5)
CO2: 27 mEq/L (ref 19–32)
Chloride: 107 mEq/L (ref 96–112)
Creatinine, Ser: 1.2 mg/dL (ref 0.4–1.2)
GFR: 49.12 mL/min — ABNORMAL LOW (ref >60.00)
GLUCOSE: 103 mg/dL — AB (ref 70–99)
Potassium: 4.5 mEq/L (ref 3.5–5.1)
Sodium: 140 mEq/L (ref 135–145)

## 2014-03-14 LAB — LIPID PANEL
CHOLESTEROL: 138 mg/dL (ref 0–200)
HDL: 33.2 mg/dL — ABNORMAL LOW (ref >39.00)
LDL Cholesterol: 86 mg/dL (ref 0–99)
NonHDL: 104.8
Total CHOL/HDL Ratio: 4
Triglycerides: 93 mg/dL (ref 0.0–149.0)
VLDL: 18.6 mg/dL (ref 0.0–40.0)

## 2014-03-14 LAB — HEMOGLOBIN A1C: Hgb A1c MFr Bld: 6.8 % — ABNORMAL HIGH (ref 4.6–6.5)

## 2014-03-14 NOTE — Patient Instructions (Signed)
BEFORE YOU LEAVE: -labs -schedule follow up in 3 months  Schedule your mammogram - call today [phone]  We advised an echo and a chest xray for the breathing issue. Please let us know if you change your mind about getting these.  We recommend the following healthy lifestyle measures: - eat a healthy diet consisting of lots of vegetables, fruits, beans, nuts, seeds, healthy meats such as white chicken and fish and whole grains.  - avoid fried foods, fast food, processed foods, sodas, red meet and other fattening foods.  - get a least 150 minutes of aerobic exercise per week.

## 2014-03-14 NOTE — Progress Notes (Signed)
Pre visit review using our clinic review tool, if applicable. No additional management support is needed unless otherwise documented below in the visit note. 

## 2014-03-14 NOTE — Progress Notes (Signed)
HPI:  HPI:  Needs Mammogram:  Flu Vaccine:  HTN/Hypokalemia:  -stable  -denies: CP, dizziness, poor energy, palpitations -on asa, hctz for LE edema and HTN, bb for tachy and HTN -mildly low potatssium at times - added lisinopril last visit and decreased diuretic -hx benign tachy remotely and on metoprolol once daily  -denies:CP, SOB, change in chronic LE swelling, palpitations  -does have mild SOB with stairs over the last year - very mild - denies cough, wheezing - no known hx CHF or lung disorder, reports feels fine when walks on treadmill -reports saw cards remotely for racing heart - echo in 2008 with normal EF  HLD:  -stable  -on statin   CKD:  -stable   GERD:  -stable on pepcid   Borderline diabetes:  -working on lifestyle changes  -had been using treadmill and had lost a few lbs  -now reports: walking daily, working on diet -acei: not taking -denies: polyuria, polydipsia, wounds on feet, neuropathy    ROS: See pertinent positives and negatives per HPI.  Past Medical History  Diagnosis Date  . Hypertension   . High cholesterol   . CKD (chronic kidney disease) 03/24/2012  . GERD (gastroesophageal reflux disease) 03/24/2012  . Lower extremity edema 03/24/2012  . Borderline diabetes 06/22/2012    Past Surgical History  Procedure Laterality Date  . Abdominal hysterectomy  1987    Family History  Problem Relation Age of Onset  . Breast cancer Mother   . Hypertension Mother   . Alzheimer's disease Mother   . Hypertension Father     History   Social History  . Marital Status: Married    Spouse Name: N/A    Number of Children: N/A  . Years of Education: N/A   Social History Main Topics  . Smoking status: Never Smoker   . Smokeless tobacco: None  . Alcohol Use: No  . Drug Use: None  . Sexual Activity: None   Other Topics Concern  . None   Social History Narrative    Current outpatient prescriptions: aspirin 81 MG tablet, Take 81 mg  by mouth daily., Disp: , Rfl: ;  famotidine (PEPCID) 20 MG tablet, Take 20 mg by mouth daily., Disp: , Rfl: ;  lisinopril-hydrochlorothiazide (PRINZIDE,ZESTORETIC) 10-12.5 MG per tablet, Take 1 tablet by mouth daily., Disp: 90 tablet, Rfl: 3;  metoprolol (LOPRESSOR) 25 MG tablet, Take 1 tablet (25 mg total) by mouth daily., Disp: 90 tablet, Rfl: 3 Potassium Gluconate 595 MG CAPS, Take by mouth daily., Disp: , Rfl: ;  simvastatin (ZOCOR) 40 MG tablet, TAKE 1 TABLET BY MOUTH ATBED TIME, Disp: 90 tablet, Rfl: 1  EXAM:  Filed Vitals:   03/14/14 1055  BP: 140/88  Pulse: 58  Temp: 97.4 F (36.3 C)    Body mass index is 29.63 kg/(m^2).  GENERAL: vitals reviewed and listed above, alert, oriented, appears well hydrated and in no acute distress  HEENT: atraumatic, conjunttiva clear, no obvious abnormalities on inspection of external nose and ears  NECK: no obvious masses on inspection, no jvd  LUNGS: clear to auscultation bilaterally, no wheezes, rales or rhonchi, good air movement  CV: HRRR, bilat chronic ankle peripheral edema  MS: moves all extremities without noticeable abnormality  PSYCH: pleasant and cooperative, no obvious depression or anxiety  ASSESSMENT AND PLAN:  Discussed the following assessment and plan:  CKD (chronic kidney disease), unspecified stage  Essential hypertension - Plan: Basic metabolic panel  Gastroesophageal reflux disease, esophagitis presence not specified  Hyperlipidemia -  Plan: Lipid Panel  Bilateral edema of lower extremity  Dyspnea  Hyperglycemia - Plan: Hemoglobin A1c  -labs: bmet, hgba1c, lipids -for the dyspnea I advised an echo and CXR but she refused this she instead prefers to call if continues or worsens  -Patient advised to return or notify a doctor immediately if symptoms worsen or persist or new concerns arise.  Patient Instructions  BEFORE YOU LEAVE: -labs -schedule follow up in 3 months  Schedule your mammogram - call today  [phone]  We advised an echo and a chest xray for the breathing issue. Please let us know if you change your mind about getting these.  We recommend the following healthy lifestyle measures: - eat a healthy diet consisting of lots of vegetables, fruits, beans, nuts, seeds, healthy meats such as white chicken and fish and whole grains.  - avoid fried foods, fast food, processed foods, sodas, red meet and other fattening foods.  - get a least 150 minutes of aerobic exercise per week.       Colin Benton R.

## 2014-03-15 ENCOUNTER — Ambulatory Visit: Payer: Medicare HMO | Admitting: Family Medicine

## 2014-03-15 NOTE — Addendum Note (Signed)
Addended by: Agnes Lawrence on: 03/15/2014 12:16 PM   Modules accepted: Orders

## 2014-03-20 ENCOUNTER — Encounter: Payer: Self-pay | Admitting: *Deleted

## 2014-04-25 ENCOUNTER — Ambulatory Visit
Admission: RE | Admit: 2014-04-25 | Discharge: 2014-04-25 | Disposition: A | Discharge status: Home / Self Care | Payer: Medicare HMO | Source: Ambulatory Visit

## 2014-04-25 DIAGNOSIS — Z1231 Encounter for screening mammogram for malignant neoplasm of breast: Secondary | ICD-10-CM

## 2014-05-18 ENCOUNTER — Other Ambulatory Visit: Payer: Self-pay | Admitting: Family Medicine

## 2014-06-13 ENCOUNTER — Ambulatory Visit (INDEPENDENT_AMBULATORY_CARE_PROVIDER_SITE_OTHER): Payer: Medicare HMO | Admitting: Family Medicine

## 2014-06-13 ENCOUNTER — Encounter: Payer: Self-pay | Admitting: Family Medicine

## 2014-06-13 VITALS — BP 126/78 | HR 67 | Temp 97.6°F | Ht 61.75 in | Wt 157.0 lb

## 2014-06-13 DIAGNOSIS — E119 Type 2 diabetes mellitus without complications: Secondary | ICD-10-CM

## 2014-06-13 DIAGNOSIS — I10 Essential (primary) hypertension: Secondary | ICD-10-CM | POA: Diagnosis not present

## 2014-06-13 DIAGNOSIS — E785 Hyperlipidemia, unspecified: Secondary | ICD-10-CM

## 2014-06-13 DIAGNOSIS — R600 Localized edema: Secondary | ICD-10-CM | POA: Diagnosis not present

## 2014-06-13 DIAGNOSIS — N189 Chronic kidney disease, unspecified: Secondary | ICD-10-CM

## 2014-06-13 LAB — BASIC METABOLIC PANEL
BUN: 31 mg/dL — ABNORMAL HIGH (ref 6–23)
CALCIUM: 10.1 mg/dL (ref 8.4–10.5)
CHLORIDE: 103 meq/L (ref 96–112)
CO2: 29 mEq/L (ref 19–32)
Creatinine, Ser: 1.31 mg/dL — ABNORMAL HIGH (ref 0.40–1.20)
GFR: 42.24 mL/min — ABNORMAL LOW (ref >60.00)
Glucose, Bld: 112 mg/dL — ABNORMAL HIGH (ref 70–99)
Potassium: 4 mEq/L (ref 3.5–5.1)
SODIUM: 138 meq/L (ref 135–145)

## 2014-06-13 LAB — HEMOGLOBIN A1C: HEMOGLOBIN A1C: 6.5 % (ref 4.6–6.5)

## 2014-06-13 NOTE — Progress Notes (Signed)
Pre visit review using our clinic review tool, if applicable. No additional management support is needed unless otherwise documented below in the visit note. 

## 2014-06-13 NOTE — Progress Notes (Signed)
HPI:  Valerie Nguyen is a spry 74 yo F whom still works 2 long days per week and reports stays busy and reports is feeling well. She is here for follow up:  HTN/Hypokalemia:  -stable  -denies: CP, dizziness, poor energy, palpitations -on asa, hctz for LE edema and HTN, bb for tachy and HTN -mildly low potatssium at times -hx benign tachy remotely and on metoprolol once daily  -denies:CP, SOB, change in chronic LE swelling, palpitations , DOE -reports saw cards remotely for racing heart - echo in 2008 with normal EF  HLD:  -stable  -on statin  -denies: leg cramps, cog changes  CKD:  -stable   GERD:  -stable on pepcid  -denies: nausea, vomiting, malaise, dysphagia  Borderline diabetes:  -working on lifestyle changes, she is watching the sweets -had been using treadmill and had lost a few lbs  -now reports: walking daily, working on diet -acei: yes -asa: yes -denies: polyuria, polydipsia, wounds on feet, neuropathy  ROS: See pertinent positives and negatives per HPI.  Past Medical History  Diagnosis Date  . Hypertension   . High cholesterol   . CKD (chronic kidney disease) 03/24/2012  . GERD (gastroesophageal reflux disease) 03/24/2012  . Lower extremity edema 03/24/2012  . Borderline diabetes 06/22/2012    Past Surgical History  Procedure Laterality Date  . Abdominal hysterectomy  1987    Family History  Problem Relation Age of Onset  . Breast cancer Mother   . Hypertension Mother   . Alzheimer's disease Mother   . Hypertension Father     History   Social History  . Marital Status: Married    Spouse Name: N/A  . Number of Children: N/A  . Years of Education: N/A   Social History Main Topics  . Smoking status: Never Smoker   . Smokeless tobacco: Not on file  . Alcohol Use: No  . Drug Use: Not on file  . Sexual Activity: Not on file   Other Topics Concern  . None   Social History Narrative     Current outpatient prescriptions:  .   aspirin 81 MG tablet, Take 81 mg by mouth daily., Disp: , Rfl:  .  famotidine (PEPCID) 20 MG tablet, Take 20 mg by mouth daily., Disp: , Rfl:  .  lisinopril-hydrochlorothiazide (PRINZIDE,ZESTORETIC) 10-12.5 MG per tablet, Take 1 tablet by mouth daily., Disp: 90 tablet, Rfl: 3 .  metoprolol (LOPRESSOR) 25 MG tablet, Take 1 tablet (25 mg total) by mouth daily., Disp: 90 tablet, Rfl: 3 .  Potassium Gluconate 595 MG CAPS, Take by mouth daily., Disp: , Rfl:  .  simvastatin (ZOCOR) 40 MG tablet, TAKE 1 TABLET BY MOUTH ATBEDTIME, Disp: 90 tablet, Rfl: 0  EXAM:  Filed Vitals:   06/13/14 0900  BP: 126/78  Pulse: 67  Temp: 97.6 F (36.4 C)    Body mass index is 28.97 kg/(m^2).  GENERAL: vitals reviewed and listed above, alert, oriented, appears well hydrated and in no acute distress  HEENT: atraumatic, conjunttiva clear, no obvious abnormalities on inspection of external nose and ears  NECK: no obvious masses on inspection  LUNGS: clear to auscultation bilaterally, no wheezes, rales or rhonchi, good air movement  CV: HRRR, no peripheral edema  MS: moves all extremities without noticeable abnormality  PSYCH: pleasant and cooperative, no obvious depression or anxiety  ASSESSMENT AND PLAN:  Discussed the following assessment and plan:  Type 2 diabetes mellitus without complication - Plan: Hemoglobin A1c  Essential hypertension - Plan: Basic  metabolic panel  Hyperlipidemia  Bilateral edema of lower extremity  CKD (chronic kidney disease), unspecified stage  -diabetic foot exam done -she declined pneumonia vaccine -advised diabetic eye exam -recheck hgba1c and BMP, may be able ot sop OTC potassium -advised lifestyle recs -Patient advised to return or notify a doctor immediately if symptoms worsen or persist or new concerns arise.  Patient Instructions  BEFORE YOU LEAVE: -labs today -follow up appointment in 3 months  We recommend the following healthy lifestyle  measures: - eat a healthy diet consisting of lots of vegetables, fruits, beans, nuts, seeds, healthy meats such as white chicken and fish and whole grains.  - avoid fried foods, fast food, processed foods, sodas, red meet and other fattening foods.  - get a least 150 minutes of aerobic exercise per week.       Colin Benton R.

## 2014-06-13 NOTE — Patient Instructions (Signed)
BEFORE YOU LEAVE: -labs today -follow up appointment in 3 months  We recommend the following healthy lifestyle measures: - eat a healthy diet consisting of lots of vegetables, fruits, beans, nuts, seeds, healthy meats such as white chicken and fish and whole grains.  - avoid fried foods, fast food, processed foods, sodas, red meet and other fattening foods.  - get a least 150 minutes of aerobic exercise per week.

## 2014-08-21 ENCOUNTER — Other Ambulatory Visit: Payer: Self-pay | Admitting: Family Medicine

## 2014-09-06 ENCOUNTER — Ambulatory Visit (INDEPENDENT_AMBULATORY_CARE_PROVIDER_SITE_OTHER): Payer: Medicare HMO | Admitting: Family Medicine

## 2014-09-06 ENCOUNTER — Encounter: Payer: Self-pay | Admitting: Family Medicine

## 2014-09-06 VITALS — BP 124/80 | HR 66 | Temp 97.9°F | Ht 61.75 in | Wt 158.3 lb

## 2014-09-06 DIAGNOSIS — E119 Type 2 diabetes mellitus without complications: Secondary | ICD-10-CM

## 2014-09-06 DIAGNOSIS — I10 Essential (primary) hypertension: Secondary | ICD-10-CM | POA: Diagnosis not present

## 2014-09-06 DIAGNOSIS — K219 Gastro-esophageal reflux disease without esophagitis: Secondary | ICD-10-CM

## 2014-09-06 DIAGNOSIS — N189 Chronic kidney disease, unspecified: Secondary | ICD-10-CM

## 2014-09-06 DIAGNOSIS — E785 Hyperlipidemia, unspecified: Secondary | ICD-10-CM | POA: Diagnosis not present

## 2014-09-06 LAB — BASIC METABOLIC PANEL
BUN: 19 mg/dL (ref 6–23)
CALCIUM: 9.8 mg/dL (ref 8.4–10.5)
CHLORIDE: 103 meq/L (ref 96–112)
CO2: 28 mEq/L (ref 19–32)
Creatinine, Ser: 1.02 mg/dL (ref 0.40–1.20)
GFR: 56.34 mL/min — AB (ref >60.00)
GLUCOSE: 107 mg/dL — AB (ref 70–99)
POTASSIUM: 3.9 meq/L (ref 3.5–5.1)
SODIUM: 137 meq/L (ref 135–145)

## 2014-09-06 NOTE — Patient Instructions (Addendum)
BEFORE YOU LEAVE: -labs -schedule MEDICARE WELLNESS EXAM in 3 months - drink plenty of water, but come fasting  STOP the acid medicine to see how you do off of this  Avoid advil, aleve, ibuprofen, naproxen and other antiinflammatory medications and drink plenty of water to help your kidneys Tylenol if you need anything for pain  We recommend the following healthy lifestyle measures: - eat a healthy diet consisting of lots of vegetables, fruits, beans, nuts, seeds, healthy meats such as white chicken and fish  - avoid fried foods, fast food, processed foods, sodas, red meet and other fattening foods.  - get a least 150 minutes of aerobic exercise per week.

## 2014-09-06 NOTE — Progress Notes (Signed)
Pre visit review using our clinic review tool, if applicable. No additional management support is needed unless otherwise documented below in the visit note. 

## 2014-09-06 NOTE — Progress Notes (Signed)
HPI:  Valerie Nguyen is a spry 74 yo F whom still works 2 long days per week and reports stays busy and reports is feeling well. She is here for follow up:  HTN/Hypokalemia:  -stable  -denies: CP, dizziness, poor energy, palpitations -on asa, hctz for LE edema and HTN, bb for tachy and HTN, lisinopril -mildly low potatssium at times -denies:CP, SOB, change in chronic LE swelling, palpitations , DOE -reports saw cards remotely for racing heart - echo in 2008 with normal EF  HLD:  -stable  -on statin  -denies: leg cramps, cog changes  CKD:  -stable   GERD:  -stable on pepcid  -denies: nausea, vomiting, malaise, dysphagia  Borderline diabetes:  -working on lifestyle changes, she is watching the sweets -had been using treadmill and had lost a few lbs  -now reports: walking daily, working on diet -acei: yes -asa: yes -denies: polyuria, polydipsia, wounds on feet, neuropathy   ROS: See pertinent positives and negatives per HPI.  Past Medical History  Diagnosis Date  . Hypertension   . High cholesterol   . CKD (chronic kidney disease) 03/24/2012  . GERD (gastroesophageal reflux disease) 03/24/2012  . Lower extremity edema 03/24/2012  . Borderline diabetes 06/22/2012    Past Surgical History  Procedure Laterality Date  . Abdominal hysterectomy  1987    Family History  Problem Relation Age of Onset  . Breast cancer Mother   . Hypertension Mother   . Alzheimer's disease Mother   . Hypertension Father     History   Social History  . Marital Status: Married    Spouse Name: N/A  . Number of Children: N/A  . Years of Education: N/A   Social History Main Topics  . Smoking status: Never Smoker   . Smokeless tobacco: Not on file  . Alcohol Use: No  . Drug Use: Not on file  . Sexual Activity: Not on file   Other Topics Concern  . None   Social History Narrative     Current outpatient prescriptions:  .  aspirin 81 MG tablet, Take 81 mg by mouth  daily., Disp: , Rfl:  .  famotidine (PEPCID) 20 MG tablet, Take 20 mg by mouth daily., Disp: , Rfl:  .  lisinopril-hydrochlorothiazide (PRINZIDE,ZESTORETIC) 10-12.5 MG per tablet, Take 1 tablet by mouth daily., Disp: 90 tablet, Rfl: 3 .  metoprolol (LOPRESSOR) 25 MG tablet, Take 1 tablet (25 mg total) by mouth daily., Disp: 90 tablet, Rfl: 3 .  simvastatin (ZOCOR) 40 MG tablet, TAKE 1 TABLET BY MOUTH ATBEDTIME, Disp: 90 tablet, Rfl: 0  EXAM:  Filed Vitals:   09/06/14 0841  BP: 124/80  Pulse: 66  Temp: 97.9 F (36.6 C)    Body mass index is 29.2 kg/(m^2).  GENERAL: vitals reviewed and listed above, alert, oriented, appears well hydrated and in no acute distress  HEENT: atraumatic, conjunttiva clear, no obvious abnormalities on inspection of external nose and ears  NECK: no obvious masses on inspection  LUNGS: clear to auscultation bilaterally, no wheezes, rales or rhonchi, good air movement  CV: HRRR, no peripheral edema  MS: moves all extremities without noticeable abnormality  PSYCH: pleasant and cooperative, no obvious depression or anxiety  ASSESSMENT AND PLAN:  Discussed the following assessment and plan:  Essential hypertension - Plan: Basic metabolic panel -stable, lifestyle recs  CKD (chronic kidney disease), unspecified stage -monitor, no nsaids  Hyperlipidemia -cont current treatment  Gastroesophageal reflux disease, esophagitis presence not specified -trial of PPI  Type 2  diabetes mellitus without complication -stable  -check BMP -MEDICARE WELLNESS  in 3 months -Patient advised to return or notify a doctor immediately if symptoms worsen or persist or new concerns arise.  Patient Instructions  BEFORE YOU LEAVE: -labs -schedule MEDICARE WELLNESS EXAM in 3 months - drink plenty of water, but come fasting  STOP the acid medicine to see how you do off of this  Avoid advil, aleve, ibuprofen, naproxen and other antiinflammatory medications and drink  plenty of water to help your kidneys Tylenol if you need anything for pain  We recommend the following healthy lifestyle measures: - eat a healthy diet consisting of lots of vegetables, fruits, beans, nuts, seeds, healthy meats such as white chicken and fish  - avoid fried foods, fast food, processed foods, sodas, red meet and other fattening foods.  - get a least 150 minutes of aerobic exercise per week.       Colin Benton R.

## 2014-11-27 ENCOUNTER — Other Ambulatory Visit: Payer: Self-pay | Admitting: Family Medicine

## 2014-12-13 ENCOUNTER — Encounter: Payer: Self-pay | Admitting: Family Medicine

## 2014-12-13 ENCOUNTER — Ambulatory Visit (INDEPENDENT_AMBULATORY_CARE_PROVIDER_SITE_OTHER): Payer: Medicare HMO | Admitting: Family Medicine

## 2014-12-13 VITALS — BP 136/82 | HR 55 | Temp 97.7°F | Ht 61.75 in | Wt 161.2 lb

## 2014-12-13 DIAGNOSIS — Z0000 Encounter for general adult medical examination without abnormal findings: Secondary | ICD-10-CM | POA: Diagnosis not present

## 2014-12-13 DIAGNOSIS — K219 Gastro-esophageal reflux disease without esophagitis: Secondary | ICD-10-CM

## 2014-12-13 DIAGNOSIS — N189 Chronic kidney disease, unspecified: Secondary | ICD-10-CM

## 2014-12-13 DIAGNOSIS — R600 Localized edema: Secondary | ICD-10-CM

## 2014-12-13 DIAGNOSIS — E785 Hyperlipidemia, unspecified: Secondary | ICD-10-CM

## 2014-12-13 DIAGNOSIS — I10 Essential (primary) hypertension: Secondary | ICD-10-CM | POA: Diagnosis not present

## 2014-12-13 DIAGNOSIS — E119 Type 2 diabetes mellitus without complications: Secondary | ICD-10-CM

## 2014-12-13 LAB — BASIC METABOLIC PANEL
BUN: 21 mg/dL (ref 6–23)
CALCIUM: 9.6 mg/dL (ref 8.4–10.5)
CHLORIDE: 102 meq/L (ref 96–112)
CO2: 27 meq/L (ref 19–32)
CREATININE: 1.1 mg/dL (ref 0.40–1.20)
GFR: 51.6 mL/min — ABNORMAL LOW (ref >60.00)
GLUCOSE: 111 mg/dL — AB (ref 70–99)
Potassium: 3.7 mEq/L (ref 3.5–5.1)
Sodium: 138 mEq/L (ref 135–145)

## 2014-12-13 LAB — LIPID PANEL
CHOLESTEROL: 147 mg/dL (ref 0–200)
HDL: 43.7 mg/dL (ref >39.00)
LDL CALC: 82 mg/dL (ref 0–99)
NonHDL: 103.29
TRIGLYCERIDES: 104 mg/dL (ref 0.0–149.0)
Total CHOL/HDL Ratio: 3
VLDL: 20.8 mg/dL (ref 0.0–40.0)

## 2014-12-13 LAB — HEMOGLOBIN A1C: HEMOGLOBIN A1C: 6.4 % (ref 4.6–6.5)

## 2014-12-13 NOTE — Progress Notes (Signed)
Medicare Annual Preventive Care Visit  (initial annual wellness or annual wellness exam)  Concerns and/or follow up today. She has diet controlled diabetes with CKD - mild, well controlled hyperlipidemia and well controlled HTN.  ROS: negative for report of fevers, unintentional weight loss, vision changes, vision loss, hearing loss or change, chest pain, sob, hemoptysis, melena, hematochezia, hematuria, genital discharge or lesions, falls, bleeding or bruising, loc, thoughts of suicide or self harm, memory loss  1.) Patient-completed health risk assessment  - completed and reviewed, see scanned documentation  2.) Review of Medical History: -PMH, PSH, Family History and current specialty and care providers reviewed and updated and listed below  - see scanned in document in chart and below  Past Medical History  Diagnosis Date  . Hypertension   . High cholesterol   . CKD (chronic kidney disease) 03/24/2012  . GERD (gastroesophageal reflux disease) 03/24/2012  . Lower extremity edema 03/24/2012  . Borderline diabetes 06/22/2012    Past Surgical History  Procedure Laterality Date  . Abdominal hysterectomy  1987    Social History   Social History  . Marital Status: Married    Spouse Name: N/A  . Number of Children: N/A  . Years of Education: N/A   Occupational History  . Not on file.   Social History Main Topics  . Smoking status: Never Smoker   . Smokeless tobacco: Not on file  . Alcohol Use: No  . Drug Use: Not on file  . Sexual Activity: Not on file   Other Topics Concern  . Not on file   Social History Narrative    The patient has a family history of  3.) Review of functional ability and level of safety:  Any difficulty hearing? YES NO  History of falling? YES NO  Any trouble with IADLs - using a phone, using transportation, grocery shopping, preparing meals, doing housework, doing laundry, taking medications and managing money? YES NO  Advance  Directives? YES NO  See summary of recommendations in Patient Instructions below.  4.) Physical Exam Filed Vitals:   12/13/14 1108  BP: 136/82  Pulse: 55  Temp: 97.7 F (36.5 C)   Estimated body mass index is 29.74 kg/(m^2) as calculated from the following:   Height as of this encounter: 5' 1.75" (1.568 m).   Weight as of this encounter: 161 lb 3.2 oz (73.12 kg).  EKG (optional): deferred  General: alert, appear well hydrated and in no acute distress  HEENT: visual acuity grossly intact  CV: HRRR, no LE edema, varicose veins  Lungs: CTA bilaterally  Psych: pleasant and cooperative, no obvious depression or anxiety  Cog function grossly intact  See patient instructions for recommendations.  Education and counseling regarding the above review of health provided with a plan for the following: -see scanned patient completed form for further details -fall prevention strategies discussed  -healthy lifestyle discussed -importance and resources for completing advanced directives discussed -see patient instructions below for any other recommendations provided  4)The following written screening schedule of preventive measures were reviewed with assessment and plan made per below, orders and patient instructions:      AAA screening: n/a     Alcohol screening: neg     Obesity Screening and counseling: done     STI screening (Hep C if born 1945-65): declined     Tobacco Screening: neg       Pneumococcal (PPSV23 -one dose after 64, one before if risk factors), influenza yearly and hepatitis B vaccines (  if high risk - end stage renal disease, IV drugs, homosexual men, live in home for mentally retarded, hemophilia receiving factors) ASSESSMENT/PLAN: 27 done, refused prevnar 59 but plans to consider      Screening mammograph (yearly if >40) ASSESSMENT/PLAN: done      Screening Pap smear/pelvic exam (q2 years) ASSESSMENT/PLAN: n/a declined      Prostate cancer  screening ASSESSMENT/PLAN: n/a      Colorectal cancer screening (FOBT yearly or flex sig q4y or colonoscopy q10y or barium enema q4y) ASSESSMENT/PLAN:      Diabetes outpatient self-management training services ASSESSMENT/PLAN: done      Bone mass measurements(covered q2y if indicated - estrogen def, osteoporosis, hyperparathyroid, vertebral abnormalities, osteoporosis or steroids) ASSESSMENT/PLAN: done      Screening for glaucoma(q1y if high risk - diabetes, FH, AA and > 50 or hispanic and > 65) ASSESSMENT/PLAN: done, sees optho yearly      Medical nutritional therapy for individuals with diabetes or renal disease ASSESSMENT/PLAN: done      Cardiovascular screening blood tests (lipids q5y) ASSESSMENT/PLAN: FASTING today      Diabetes screening tests ASSESSMENT/PLAN: FASING labs today   7.) Summary:  Visit for preventive health examination -risk factors and conditions per above assessment were discussed and treatment, recommendations and referrals were offered per documentation above and orders and patient instructions.  CKD (chronic kidney disease), unspecified stage - Plan: Basic metabolic panel -monitor, control BP and diabetes  Essential hypertension - Plan: Basic metabolic panel -great on recheck, cont current tx  Hyperlipidemia - Plan: Lipid Panel Type 2 diabetes mellitus without complication - Plan: Hemoglobin A1c -labs -prevention of progression of disease with lifestyle discussed at length, advised yearly eye exams and reason for this, check labs today   Patient Instructions  BEFORE YOU LEAVE: -labs -schedule follow up in 6 months  -please get you flu and prevnar 13 vaccines  -We have ordered labs or studies at this visit. It can take up to 1-2 weeks for results and processing. We will contact you with instructions IF your results are abnormal. Normal results will be released to your [facility]. If you have not heard from [place] or can not find your results in  [facility] in 2 weeks please contact our office.  We recommend the following healthy lifestyle measures: - eat a healthy diet consisting of lots of vegetables, fruits, beans, nuts, seeds, healthy meats such as white chicken and fish and whole grains.  - avoid fried foods, fast food, processed foods, sodas, red meet and other fattening foods.  - get a least 150 minutes of aerobic exercise per week.

## 2014-12-13 NOTE — Progress Notes (Signed)
Pre visit review using our clinic review tool, if applicable. No additional management support is needed unless otherwise documented below in the visit note. 

## 2014-12-13 NOTE — Patient Instructions (Addendum)
BEFORE YOU LEAVE: -labs -schedule follow up in 6 months  -please get you flu and prevnar 13 vaccines  -We have ordered labs or studies at this visit. It can take up to 1-2 weeks for results and processing. We will contact you with instructions IF your results are abnormal. Normal results will be released to your [facility]. If you have not heard from [place] or can not find your results in [facility] in 2 weeks please contact our office.  We recommend the following healthy lifestyle measures: - eat a healthy diet consisting of lots of vegetables, fruits, beans, nuts, seeds, healthy meats such as white chicken and fish and whole grains.  - avoid fried foods, fast food, processed foods, sodas, red meet and other fattening foods.  - get a least 150 minutes of aerobic exercise per week.

## 2015-03-05 ENCOUNTER — Emergency Department (HOSPITAL_BASED_OUTPATIENT_CLINIC_OR_DEPARTMENT_OTHER)
Admission: EM | Admit: 2015-03-05 | Discharge: 2015-03-05 | Disposition: A | Discharge status: Home / Self Care | Payer: Medicare HMO | Attending: Emergency Medicine | Admitting: Emergency Medicine

## 2015-03-05 ENCOUNTER — Emergency Department (HOSPITAL_BASED_OUTPATIENT_CLINIC_OR_DEPARTMENT_OTHER): Payer: Medicare HMO

## 2015-03-05 ENCOUNTER — Telehealth: Payer: Self-pay | Admitting: Family Medicine

## 2015-03-05 ENCOUNTER — Encounter (HOSPITAL_BASED_OUTPATIENT_CLINIC_OR_DEPARTMENT_OTHER): Payer: Self-pay | Admitting: Emergency Medicine

## 2015-03-05 DIAGNOSIS — I8001 Phlebitis and thrombophlebitis of superficial vessels of right lower extremity: Secondary | ICD-10-CM | POA: Insufficient documentation

## 2015-03-05 DIAGNOSIS — K219 Gastro-esophageal reflux disease without esophagitis: Secondary | ICD-10-CM | POA: Insufficient documentation

## 2015-03-05 DIAGNOSIS — N189 Chronic kidney disease, unspecified: Secondary | ICD-10-CM | POA: Diagnosis not present

## 2015-03-05 DIAGNOSIS — Z7982 Long term (current) use of aspirin: Secondary | ICD-10-CM | POA: Insufficient documentation

## 2015-03-05 DIAGNOSIS — R609 Edema, unspecified: Secondary | ICD-10-CM

## 2015-03-05 DIAGNOSIS — E7800 Pure hypercholesterolemia, unspecified: Secondary | ICD-10-CM | POA: Diagnosis not present

## 2015-03-05 DIAGNOSIS — R600 Localized edema: Secondary | ICD-10-CM | POA: Diagnosis not present

## 2015-03-05 DIAGNOSIS — M7989 Other specified soft tissue disorders: Secondary | ICD-10-CM | POA: Diagnosis present

## 2015-03-05 DIAGNOSIS — I129 Hypertensive chronic kidney disease with stage 1 through stage 4 chronic kidney disease, or unspecified chronic kidney disease: Secondary | ICD-10-CM | POA: Diagnosis not present

## 2015-03-05 DIAGNOSIS — Z79899 Other long term (current) drug therapy: Secondary | ICD-10-CM | POA: Insufficient documentation

## 2015-03-05 DIAGNOSIS — L03115 Cellulitis of right lower limb: Secondary | ICD-10-CM | POA: Diagnosis not present

## 2015-03-05 DIAGNOSIS — I809 Phlebitis and thrombophlebitis of unspecified site: Secondary | ICD-10-CM

## 2015-03-05 DIAGNOSIS — M79609 Pain in unspecified limb: Secondary | ICD-10-CM | POA: Diagnosis not present

## 2015-03-05 MED ORDER — CEPHALEXIN 500 MG PO CAPS: 500.0000 mg | ORAL_CAPSULE | Freq: Four times a day (QID) | ORAL | Status: AC

## 2015-03-05 NOTE — Telephone Encounter (Signed)
No answer at the pts home number.  I left a detailed message at the pts daughter's cell numebr for the pt to go to an urgent care as soon as possible per Dr Maudie Mercury as we do not have any openings here with any of our doctors.

## 2015-03-05 NOTE — Telephone Encounter (Signed)
SWELLING FOR 2 DAYS ON THE LEG, REDNESS FROM KNEE TO THE ANKLE ON THE RIGHT LEG.  SHE STATES THAT SHE HAD TO GO TO [place] ONCE BEFORE FOR A BLOOD CLOT, SHE DID NOT HAVE A CLOT AND WAS GIVEN SOME ANTIBIOTICS.  SHE HAS BEEN SICK X 2 DAYS.  SHE WAS SICK ON SUNDAY, COLD FEVER, DIZZY.  SHE HAS SOME SORENESS ON HER LEG.  SOME SLIGHT SORENESS.  NO SWELLING NOTED.  SLIGHTLY WARM TO TOUCH.

## 2015-03-05 NOTE — ED Provider Notes (Signed)
CSN: [id number]     Arrival date & time 03/05/15  1705 History  By signing my name below, I, Erling Conte, attest that this documentation has been prepared under the direction and in the presence of No att. providers found. Electronically Signed: Erling Conte, ED Scribe. 03/06/2015. 1:09 PM.    Chief Complaint  Patient presents with  . Leg Swelling   The history is provided by the patient and a relative (daughter). No language interpreter was used.    HPI Comments: Valerie Nguyen is a 74 y.o. female with a h/o HTN, high cholesterol, CKD, and borderline diabetic, who presents to the Emergency Department complaining of intermittent, mild, right calf swelling onset today. Pt notes the leg was mild itchy yesterday prior to the onset of the symptom along with associated tingling sensation in the right leg. Pt endorses that she has had similar symptoms like this 2x in the past with the first episode being around 10 years ago. She went to to her PCP today for this and was told to come here for evaluation. She denies any alleviating/aggravating factors. Pt's daughter notes the pt was having fever, chills, nausea and dizziness 2 days ago but those symptoms resolved prior to the onset of the right leg swelling. Pt takes aspirin 81 mg every morning. She denies any fever, chills, nausea, vomiting, dysuria, chest pain, SOB, abdominal pain, or other associated symptoms. She denies any known medication allergies.  Past Medical History  Diagnosis Date  . Hypertension   . High cholesterol   . CKD (chronic kidney disease) 03/24/2012  . GERD (gastroesophageal reflux disease) 03/24/2012  . Lower extremity edema 03/24/2012  . Borderline diabetes 06/22/2012   Past Surgical History  Procedure Laterality Date  . Abdominal hysterectomy  1987   Family History  Problem Relation Age of Onset  . Breast cancer Mother   . Hypertension Mother   . Alzheimer's disease Mother   . Hypertension Father    Social History   Substance Use Topics  . Smoking status: Never Smoker   . Smokeless tobacco: None  . Alcohol Use: No   OB History    No data available     Review of Systems  Constitutional: Negative for fever and chills.  HENT: Negative for sore throat.   Eyes: Negative for visual disturbance.  Respiratory: Negative for cough and shortness of breath.   Cardiovascular: Positive for leg swelling. Negative for chest pain.  Gastrointestinal: Negative for nausea, vomiting and abdominal pain.  Genitourinary: Negative for dysuria and difficulty urinating.  Musculoskeletal: Negative for back pain and neck pain.  Skin: Negative for rash.  Neurological: Negative for syncope and headaches.      Allergies  Review of patient's allergies indicates no known allergies.  Home Medications   Prior to Admission medications   Medication Sig Start Date End Date Taking? Authorizing Provider  aspirin 81 MG tablet Take 81 mg by mouth daily.    Historical Provider, MD  cephALEXin (KEFLEX) 500 MG capsule Take 1 capsule (500 mg total) by mouth 4 (four) times daily. 03/05/15 03/12/15  Gareth Morgan, MD  famotidine (PEPCID) 20 MG tablet Take 20 mg by mouth daily.    Historical Provider, MD  lisinopril-hydrochlorothiazide (PRINZIDE,ZESTORETIC) 10-12.5 MG per tablet Take 1 tablet by mouth daily. 02/09/14   Lucretia Kern, DO  metoprolol (LOPRESSOR) 25 MG tablet Take 1 tablet (25 mg total) by mouth daily. 02/08/14   Lucretia Kern, DO  simvastatin (ZOCOR) 40 MG tablet TAKE 1 TABLET  BY MOUTH ATBEDTIME 11/27/14   Lucretia Kern, DO   Triage Vitals: BP 120/61 mmHg  Pulse 66  Temp(Src) 97.9 F (36.6 C) (Oral)  Resp 18  Ht 5\' 2"  (1.575 m)  Wt 172 lb (78.019 kg)  BMI 31.45 kg/m2  SpO2 100%  Physical Exam  Constitutional: She is oriented to person, place, and time. She appears well-developed and well-nourished. No distress.  HENT:  Head: Normocephalic and atraumatic.  Eyes: Conjunctivae and EOM are normal.  Neck: Normal  range of motion. Neck supple. No tracheal deviation present.  Cardiovascular: Normal rate, regular rhythm, normal heart sounds and intact distal pulses.  Exam reveals no gallop and no friction rub.   No murmur heard. Pulses:      Dorsalis pedis pulses are 2+ on the right side, and 2+ on the left side.  Pulmonary/Chest: Effort normal and breath sounds normal. No respiratory distress. She has no wheezes. She has no rales.  Abdominal: Soft. She exhibits no distension. There is no tenderness. There is no guarding.  Musculoskeletal: Normal range of motion. She exhibits no edema.       Right lower leg: She exhibits tenderness and swelling.  Swelling RLE Engorged right superficial veins with tenderness medial right calf extending towards popliteal fossa   Neurological: She is alert and oriented to person, place, and time.  Skin: Skin is warm and dry. No rash noted. She is not diaphoretic. There is erythema (erythema tracking up medial calf vein towards groin tand extending around lower irght leg).  Psychiatric: She has a normal mood and affect. Her behavior is normal.  Nursing note and vitals reviewed.   ED Course  Procedures (including critical care time)  DIAGNOSTIC STUDIES: Oxygen Saturation is 100% on RA, normal by my interpretation.    COORDINATION OF CARE: 5:17 PM- Will order [place] lower unilateral right leg. Pt advised of plan for treatment and pt agrees.  8:02 PM- Recommended pt to f/u with PCP within the next week. Advised pt if she develops a fever or redness of her leg spreads to other areas to return to ER. Will d/c pt home with abx. Pt advised of plan for treatment and pt agrees.   Labs Review Labs Reviewed - No data to display  Imaging Review US Venous Img Lower Unilateral Right  03/05/2015  CLINICAL DATA:  Right lower extremity swelling and redness for 3 days. Edema, color changes, varicose veins. EXAM: Right LOWER EXTREMITY VENOUS DOPPLER ULTRASOUND TECHNIQUE: Gray-scale  sonography with graded compression, as well as color Doppler and duplex ultrasound were performed to evaluate the lower extremity deep venous systems from the level of the common femoral vein and including the common femoral, femoral, profunda femoral, popliteal and calf veins including the posterior tibial, peroneal and gastrocnemius veins when visible. The superficial great saphenous vein was also interrogated. Spectral Doppler was utilized to evaluate flow at rest and with distal augmentation maneuvers in the common femoral, femoral and popliteal veins. COMPARISON:  None. FINDINGS: Contralateral Common Femoral Vein: Respiratory phasicity is normal and symmetric with the symptomatic side. No evidence of thrombus. Normal compressibility. Common Femoral Vein: No evidence of thrombus. Normal compressibility, respiratory phasicity and response to augmentation. Saphenofemoral Junction: No evidence of thrombus. Normal compressibility and flow on color Doppler imaging. Profunda Femoral Vein: No evidence of thrombus. Normal compressibility and flow on color Doppler imaging. Femoral Vein: No evidence of thrombus. Normal compressibility, respiratory phasicity and response to augmentation. Popliteal Vein: No evidence of thrombus. Normal compressibility, respiratory phasicity  and response to augmentation. Calf Veins: No evidence of thrombus. Normal compressibility and flow on color Doppler imaging. Superficial Great Saphenous Vein: No evidence of thrombus. Normal compressibility and flow on color Doppler imaging. Venous Reflux:  None. Other Findings: Superficial venous varicosities are noted in the medial aspect of the right calf, some with evidence of thrombus. Incidental note of a prominent lymph node in the right groin which has a fatty center consistent with benign lymph node. IMPRESSION: No evidence of deep venous thrombosis. Thrombosed superficial venous varicosities are noted in the medial aspect of the right calf.  Electronically Signed   By: Lucienne Capers M.D.   On: 03/05/2015 19:10   I have personally reviewed and evaluated these images as part of my medical decision-making.   EKG Interpretation None      MDM   Final diagnoses:  Superficial thrombophlebitis  Cellulitis of right lower extremity   74yo female with history of CKD, htn, hlpd presents with concern for right lower extremity erythema and swelling.  DVT US shows no sign of DVT, however shows superfiial venous varicosities with thrombosis with overall history and physical and imaging consistent with superficial thrombophlebitis.  Pt is well appearing, afebrile without signs of sepsis on exam. Erythema does extend beyond area of veins and will treat for concomitant cellulitis. Doubt suppurative thrombophlebitis in abscess of vein procedure and pt well appearing.  Will treat with keflex and recommend follow up with PCP this week, elevation, warm compresses, monitoring for worsening symptoms. Patient discharged in stable condition with understanding of reasons to return.   I personally performed the services described in this documentation, which was scribed in my presence. The recorded information has been reviewed and is accurate.     Gareth Morgan, MD 03/06/15 1314

## 2015-03-05 NOTE — ED Notes (Signed)
patient went to her PMD today for right leg swelling. Md concerned that she has a right leg blood clot

## 2015-03-05 NOTE — Telephone Encounter (Signed)
Needs urgent appt here or [facility] if full here.

## 2015-03-05 NOTE — Discharge Instructions (Signed)

## 2015-03-06 ENCOUNTER — Other Ambulatory Visit: Payer: Self-pay | Admitting: Family Medicine

## 2015-03-29 ENCOUNTER — Encounter: Payer: Self-pay | Admitting: Family Medicine

## 2015-03-29 ENCOUNTER — Ambulatory Visit (INDEPENDENT_AMBULATORY_CARE_PROVIDER_SITE_OTHER): Payer: Medicare HMO | Admitting: Family Medicine

## 2015-03-29 VITALS — BP 112/80 | HR 61 | Temp 97.6°F | Ht 62.0 in | Wt 161.3 lb

## 2015-03-29 DIAGNOSIS — I8390 Asymptomatic varicose veins of unspecified lower extremity: Secondary | ICD-10-CM

## 2015-03-29 DIAGNOSIS — R600 Localized edema: Secondary | ICD-10-CM

## 2015-03-29 DIAGNOSIS — I868 Varicose veins of other specified sites: Secondary | ICD-10-CM | POA: Diagnosis not present

## 2015-03-29 DIAGNOSIS — I82811 Embolism and thrombosis of superficial veins of right lower extremities: Secondary | ICD-10-CM | POA: Diagnosis not present

## 2015-03-29 NOTE — Patient Instructions (Signed)
Follow up as scheduled, sooner if needed  Compression daily - remove at night  Elevation of legs 30 minutes 1-2 times per day  We placed a referral for you as discussed to the vascular specialist per your request. It usually takes about 1-2 weeks to process and schedule this referral. If you have not heard from [place] regarding this appointment in 2 weeks please contact our office.

## 2015-03-29 NOTE — Progress Notes (Signed)
Pre visit review using our clinic review tool, if applicable. No additional management support is needed unless otherwise documented below in the visit note. 

## 2015-03-29 NOTE — Progress Notes (Signed)
HPI:   Valerie Nguyen is a pleasant 75 yo with a PMH sig for HTN, HLD, CKD, LE edema here for a follow up visit from ER visit about 1 month ago. Notes state she came here for this and we told her to go to the ER, but she did not come here for this and has not been seen in several months. She went to the ER for some R leg pain and itching and was found to have thrombosed superficial varicose veins in medial R thigh on duplex,  treated with ?keflex and warm compresses. Reports she had rednes of skin on amlost entire leg at the time.Now all resolved. Has long standing bilat LE edema and varicose veins and daughter reports hx of recurrent thrombosed varicosities. Pt and daughter request referral to vasc doctor regarding her varicosities. She does not use compression socks.   ROS: See pertinent positives and negatives per HPI.  Past Medical History  Diagnosis Date  . Hypertension   . High cholesterol   . CKD (chronic kidney disease) 03/24/2012  . GERD (gastroesophageal reflux disease) 03/24/2012  . Lower extremity edema 03/24/2012  . Borderline diabetes 06/22/2012    Past Surgical History  Procedure Laterality Date  . Abdominal hysterectomy  1987    Family History  Problem Relation Age of Onset  . Breast cancer Mother   . Hypertension Mother   . Alzheimer's disease Mother   . Hypertension Father     Social History   Social History  . Marital Status: Married    Spouse Name: N/A  . Number of Children: N/A  . Years of Education: N/A   Social History Main Topics  . Smoking status: Never Smoker   . Smokeless tobacco: None  . Alcohol Use: No  . Drug Use: None  . Sexual Activity: Not Asked   Other Topics Concern  . None   Social History Narrative     Current outpatient prescriptions:  .  aspirin 81 MG tablet, Take 81 mg by mouth daily., Disp: , Rfl:  .  famotidine (PEPCID) 20 MG tablet, Take 20 mg by mouth daily., Disp: , Rfl:  .  lisinopril-hydrochlorothiazide  (PRINZIDE,ZESTORETIC) 10-12.5 MG tablet, TAKE 1 TABLET BY MOUTH DAILY., Disp: 90 tablet, Rfl: 1 .  metoprolol tartrate (LOPRESSOR) 25 MG tablet, TAKE 1 TABLET (25 MG TOTAL) BY MOUTH DAILY., Disp: 90 tablet, Rfl: 1 .  simvastatin (ZOCOR) 40 MG tablet, TAKE 1 TABLET BY MOUTH ATBEDTIME, Disp: 90 tablet, Rfl: 1  EXAM:  Filed Vitals:   03/29/15 1120  BP: 112/80  Pulse: 61  Temp: 97.6 F (36.4 C)    Body mass index is 29.49 kg/(m^2).  GENERAL: vitals reviewed and listed above, alert, oriented, appears well hydrated and in no acute distress  HEENT: atraumatic, conjunttiva clear, no obvious abnormalities on inspection of external nose and ears  NECK: no obvious masses on inspection  LUNGS: clear to auscultation bilaterally, no wheezes, rales or rhonchi, good air movement  CV: HRRR, tr bilateral LE ankle and lower calf edema, significant varicose veins R > L of LE without swelling, redness or tenderness of superficial or deep veins  MS: moves all extremities without noticeable abnormality  PSYCH: pleasant and cooperative, no obvious depression or anxiety  ASSESSMENT AND PLAN:  Discussed the following assessment and plan:  Varicose veins  Superficial thrombosis of leg, right  Bilateral edema of lower extremity  -we discussed possible serious and likely etiologies, workup and treatment, treatment risks and return precautions -after  this discussion, Mitsuyo opted for eval with vascular specialist per her an dher daughters wishes due to sig varicose veins and hx recurrent superficial thrombophlebitis per their report. No signs of thrombophlebitis today. Advised compression and elevation in interim. -of course, we advised Margarett  to return or notify a doctor immediately if symptoms worsen or persist or new concerns arise.  -Patient advised to return or notify a doctor immediately if symptoms worsen or persist or new concerns arise.  Patient Instructions  Follow up as scheduled, sooner  if needed  Compression daily - remove at night  Elevation of legs 30 minutes 1-2 times per day  We placed a referral for you as discussed to the vascular specialist per your request. It usually takes about 1-2 weeks to process and schedule this referral. If you have not heard from [place] regarding this appointment in 2 weeks please contact our office.      Colin Benton R.

## 2015-04-16 ENCOUNTER — Encounter: Payer: Self-pay | Admitting: Vascular Surgery

## 2015-04-25 ENCOUNTER — Ambulatory Visit (INDEPENDENT_AMBULATORY_CARE_PROVIDER_SITE_OTHER): Payer: Medicare HMO | Admitting: Vascular Surgery

## 2015-04-25 ENCOUNTER — Ambulatory Visit (HOSPITAL_COMMUNITY)
Admission: RE | Admit: 2015-04-25 | Discharge: 2015-04-25 | Disposition: A | Discharge status: Home / Self Care | Payer: Medicare HMO | Source: Ambulatory Visit | Attending: Vascular Surgery | Admitting: Vascular Surgery

## 2015-04-25 ENCOUNTER — Encounter: Payer: Self-pay | Admitting: Vascular Surgery

## 2015-04-25 VITALS — BP 125/72 | HR 71 | Temp 97.1°F | Resp 16 | Ht 61.0 in | Wt 160.0 lb

## 2015-04-25 DIAGNOSIS — N189 Chronic kidney disease, unspecified: Secondary | ICD-10-CM | POA: Diagnosis not present

## 2015-04-25 DIAGNOSIS — I82811 Embolism and thrombosis of superficial veins of right lower extremities: Secondary | ICD-10-CM | POA: Insufficient documentation

## 2015-04-25 DIAGNOSIS — M7989 Other specified soft tissue disorders: Secondary | ICD-10-CM

## 2015-04-25 DIAGNOSIS — E7800 Pure hypercholesterolemia, unspecified: Secondary | ICD-10-CM | POA: Diagnosis not present

## 2015-04-25 DIAGNOSIS — I129 Hypertensive chronic kidney disease with stage 1 through stage 4 chronic kidney disease, or unspecified chronic kidney disease: Secondary | ICD-10-CM | POA: Diagnosis not present

## 2015-04-25 DIAGNOSIS — I83891 Varicose veins of right lower extremities with other complications: Secondary | ICD-10-CM | POA: Insufficient documentation

## 2015-04-25 NOTE — Progress Notes (Signed)
Vascular and Vein Specialist of [facility]  Patient name: Valerie Nguyen MRN: [id number] DOB: 1940/12/11 Sex: female Referring physician: Dr. Blima Singer REASON FOR CONSULT: Symptomatic varicose veins  HPI: Valerie Nguyen is a 75 y.o. female, who is sent for evaluation of varicose veins with multiple recurrent episodes of thrombophlebitis. The patient has had 3 episodes of thrombophlebitis within the past 7 years. These have been treated in the past with elevation and antibiotics. She denies any prior history of DVT. She does have family history of varicose veins in her mother and aunt. She denies any prior operations on her legs. She currently wears some light weight compression. She had a venous duplex ultrasound on 03/05/2015 [facility] which showed no DVT. She develops heaviness aching this fullness and swelling in her legs as the day proceeds. This is relieved by elevation overnight.  Other medical problems include hypertension, elevated cholesterol, borderline diabetes all of which have been stable.  Past Medical History  Diagnosis Date  . Hypertension   . High cholesterol   . CKD (chronic kidney disease) 03/24/2012  . GERD (gastroesophageal reflux disease) 03/24/2012  . Lower extremity edema 03/24/2012  . Borderline diabetes 06/22/2012  . Varicose veins     Family History  Problem Relation Age of Onset  . Breast cancer Mother   . Hypertension Mother   . Alzheimer's disease Mother   . Hypertension Father     SOCIAL HISTORY: Social History   Social History  . Marital Status: Married    Spouse Name: N/A  . Number of Children: N/A  . Years of Education: N/A   Occupational History  . Not on file.   Social History Main Topics  . Smoking status: Never Smoker   . Smokeless tobacco: Never Used  . Alcohol Use: No  . Drug Use: No  . Sexual Activity: Not on file   Other Topics Concern  . Not on file   Social History Narrative    No Known Allergies  Current Outpatient  Prescriptions  Medication Sig Dispense Refill  . aspirin 81 MG tablet Take 81 mg by mouth daily.    . famotidine (PEPCID) 20 MG tablet Take 20 mg by mouth daily.    [name] lisinopril-hydrochlorothiazide (PRINZIDE,ZESTORETIC) 10-12.5 MG tablet TAKE 1 TABLET BY MOUTH DAILY. 90 tablet 1  . metoprolol tartrate (LOPRESSOR) 25 MG tablet TAKE 1 TABLET (25 MG TOTAL) BY MOUTH DAILY. 90 tablet 1  . simvastatin (ZOCOR) 40 MG tablet TAKE 1 TABLET BY MOUTH ATBEDTIME 90 tablet 1   No current facility-administered medications for this visit.    REVIEW OF SYSTEMS:  [X]  denotes positive finding, [ ]  denotes negative finding Cardiac  Comments:  Chest pain or chest pressure:    Shortness of breath upon exertion:    Short of breath when lying flat:    Irregular heart rhythm:        Vascular    Pain in calf, thigh, or hip brought on by ambulation:    Pain in feet at night that wakes you up from your sleep:     Blood clot in your veins:    Leg swelling:  x       Pulmonary    Oxygen at home:    Productive cough:     Wheezing:         Neurologic    Sudden weakness in arms or legs:     Sudden numbness in arms or legs:     Sudden onset  of difficulty speaking or slurred speech:    Temporary loss of vision in one eye:     Problems with dizziness:         Gastrointestinal    Blood in stool:     Vomited blood:         Genitourinary    Burning when urinating:     Blood in urine:        Psychiatric    Major depression:         Hematologic    Bleeding problems:    Problems with blood clotting too easily:        Skin    Rashes or ulcers:        Constitutional    Fever or chills:      PHYSICAL EXAM: Filed Vitals:   04/25/15 0957  BP: 125/72  Pulse: 71  Temp: 97.1 F (36.2 C)  TempSrc: Oral  Resp: 16  Height: 5\' 1"  (1.549 m)  Weight: 160 lb (72.576 kg)  SpO2: 100%    GENERAL: The patient is a well-nourished female, in no acute distress. The vital signs are documented above. CARDIAC:  There is a regular rate and rhythm without murmur VASCULAR: 2+ femoral popliteal dorsalis pedis pulses bilaterally PULMONARY: There is good air exchange bilaterally without wheezing or rales. ABDOMEN: Soft and non-tender with normal pitched bowel sounds.  MUSCULOSKELETAL: There are no major deformities or cyanosis. She does have trace edema bilaterally. She has palpable obviously visible varicosities over the course of the right greater saphenous vein extending from the knee down to the ankle. Vein diameter 7-8 mm on clinical exam. She has spider varicosities and reticular is diffusely throughout the right and left legs. NEUROLOGIC: No focal weakness or paresthesias are detected. SKIN: There are no ulcers or rashes noted. PSYCHIATRIC: The patient has a normal affect.  DATA: Patient had a venous reflux exam of the right leg today. This showed reflux in the right common femoral vein as well as the saphenofemoral junction and greater saphenous vein. Vein diameter on the right side was 7-10 mm.   MEDICAL ISSUES: Patient with symptomatic varicose veins right lower extremity. She does have evidence of varicosities in the left leg but these are not really bothersome to her. She was given up her prescription today for bilateral thigh-high compression stockings for symptomatically. She will follow-up with [place] in 3 months time for consideration of laser ablation of the right leg.   Ruta Hinds [facility] [employer]: [phone]

## 2015-06-13 ENCOUNTER — Ambulatory Visit: Payer: Medicare HMO | Admitting: Family Medicine

## 2015-06-20 ENCOUNTER — Ambulatory Visit (INDEPENDENT_AMBULATORY_CARE_PROVIDER_SITE_OTHER): Payer: Medicare HMO | Admitting: Family Medicine

## 2015-06-20 ENCOUNTER — Encounter: Payer: Self-pay | Admitting: Family Medicine

## 2015-06-20 VITALS — BP 128/82 | HR 54 | Temp 98.1°F | Ht 61.0 in | Wt 161.2 lb

## 2015-06-20 DIAGNOSIS — E1121 Type 2 diabetes mellitus with diabetic nephropathy: Secondary | ICD-10-CM | POA: Diagnosis not present

## 2015-06-20 DIAGNOSIS — I10 Essential (primary) hypertension: Secondary | ICD-10-CM

## 2015-06-20 DIAGNOSIS — E785 Hyperlipidemia, unspecified: Secondary | ICD-10-CM

## 2015-06-20 DIAGNOSIS — E119 Type 2 diabetes mellitus without complications: Secondary | ICD-10-CM

## 2015-06-20 DIAGNOSIS — I8390 Asymptomatic varicose veins of unspecified lower extremity: Secondary | ICD-10-CM

## 2015-06-20 DIAGNOSIS — I868 Varicose veins of other specified sites: Secondary | ICD-10-CM

## 2015-06-20 DIAGNOSIS — K219 Gastro-esophageal reflux disease without esophagitis: Secondary | ICD-10-CM

## 2015-06-20 LAB — BASIC METABOLIC PANEL
BUN: 31 mg/dL — AB (ref 6–23)
CHLORIDE: 102 meq/L (ref 96–112)
CO2: 27 meq/L (ref 19–32)
CREATININE: 1.2 mg/dL (ref 0.40–1.20)
Calcium: 9.7 mg/dL (ref 8.4–10.5)
GFR: 46.6 mL/min — ABNORMAL LOW (ref >60.00)
Glucose, Bld: 106 mg/dL — ABNORMAL HIGH (ref 70–99)
Potassium: 3.7 mEq/L (ref 3.5–5.1)
Sodium: 137 mEq/L (ref 135–145)

## 2015-06-20 LAB — HEMOGLOBIN A1C: Hgb A1c MFr Bld: 6.5 % (ref 4.6–6.5)

## 2015-06-20 LAB — LIPID PANEL
CHOL/HDL RATIO: 4
Cholesterol: 147 mg/dL (ref 0–200)
HDL: 40.1 mg/dL (ref >39.00)
LDL CALC: 90 mg/dL (ref 0–99)
NonHDL: 107.21
TRIGLYCERIDES: 87 mg/dL (ref 0.0–149.0)
VLDL: 17.4 mg/dL (ref 0.0–40.0)

## 2015-06-20 NOTE — Progress Notes (Signed)
HPI:  Follow up:  Diet controlled DM: -w/ mild CKD -meds: asa -lifestlye: She gets on treadmill daily at home, diet so so -denies: Wounds on feet, vision issues, polyuria  HTN with diabetes: -meds: lisinopril-hctz, metoprolol tartrate -denies: CP, SOB, DOE  HLD: -meds: simvastatin -stable  GERD: -meds: pepcid -stable  Varicose veins/Thrombophlebitis: -saw vascular, compression advised -may undergo ablation -wearing thigh high compression socks  ROS: See pertinent positives and negatives per HPI.  Past Medical History  Diagnosis Date  . Hypertension   . High cholesterol   . CKD (chronic kidney disease) 03/24/2012  . GERD (gastroesophageal reflux disease) 03/24/2012  . Lower extremity edema 03/24/2012  . Borderline diabetes 06/22/2012  . Varicose veins     Past Surgical History  Procedure Laterality Date  . Abdominal hysterectomy  1987    Family History  Problem Relation Age of Onset  . Breast cancer Mother   . Hypertension Mother   . Alzheimer's disease Mother   . Hypertension Father     Social History   Social History  . Marital Status: Married    Spouse Name: N/A  . Number of Children: N/A  . Years of Education: N/A   Social History Main Topics  . Smoking status: Never Smoker   . Smokeless tobacco: Never Used  . Alcohol Use: No  . Drug Use: No  . Sexual Activity: Not Asked   Other Topics Concern  . None   Social History Narrative     Current outpatient prescriptions:  .  aspirin 81 MG tablet, Take 81 mg by mouth daily., Disp: , Rfl:  .  famotidine (PEPCID) 20 MG tablet, Take 20 mg by mouth daily., Disp: , Rfl:  .  lisinopril-hydrochlorothiazide (PRINZIDE,ZESTORETIC) 10-12.5 MG tablet, TAKE 1 TABLET BY MOUTH DAILY., Disp: 90 tablet, Rfl: 1 .  metoprolol tartrate (LOPRESSOR) 25 MG tablet, TAKE 1 TABLET (25 MG TOTAL) BY MOUTH DAILY., Disp: 90 tablet, Rfl: 1 .  simvastatin (ZOCOR) 40 MG tablet, TAKE 1 TABLET BY MOUTH ATBEDTIME, Disp: 90 tablet,  Rfl: 1  EXAM:  Filed Vitals:   06/20/15 0852  BP: 128/82  Pulse: 54  Temp: 98.1 F (36.7 C)    Body mass index is 30.47 kg/(m^2).  GENERAL: vitals reviewed and listed above, alert, oriented, appears well hydrated and in no acute distress  HEENT: atraumatic, conjunttiva clear, no obvious abnormalities on inspection of external nose and ears  NECK: no obvious masses on inspection  LUNGS: clear to auscultation bilaterally, no wheezes, rales or rhonchi, good air movement  CV: HRRR, varicose and spider veins, mild peripheral edema, wearing compression socks,   MS: moves all extremities without noticeable abnormality  PSYCH: pleasant and cooperative, no obvious depression or anxiety  FOOT exam done  ASSESSMENT AND PLAN:  Discussed the following assessment and plan:  Diet-controlled diabetes mellitus ([place]) - Plan: Hemoglobin A1C  Essential hypertension - Plan: Basic metabolic panel  Hyperlipidemia - Plan: Lipid Panel  Diabetic nephropathy associated with type 2 diabetes mellitus ([facility])  Gastroesophageal reflux disease, esophagitis presence not specified  Varicose veins -flu shot offered - refused -eye exam advised - she reports is scheduled for April 30th -mammogram advised - she agrees to schedule -foot exam done -FASTING labs -Patient advised to return or notify a doctor immediately if symptoms worsen or persist or new concerns arise.  Patient Instructions  Before you leave: -Labs -Schedule follow-up in 4-6 months  AmLactin foot cream nightly, please gently exfoliate calluses in the shower.  Please get  your diabetic eye exam as scheduled, and have your eye doctor fax the report to our office.  Please call today to schedule your mammogram  We recommend the following healthy lifestyle measures: - eat a healthy whole foods diet consisting of regular small meals composed of vegetables, fruits, beans, nuts, seeds, healthy meats such as white chicken and fish and  whole grains.  - avoid sweets, white starchy foods, fried foods, fast food, processed foods, sodas, red meet and other fattening foods.  - get a least 150-300 minutes of aerobic exercise per week.   We recommend the following healthy lifestyle measures: - eat a healthy whole foods diet consisting of regular small meals composed of vegetables, fruits, beans, nuts, seeds, healthy meats such as white chicken and fish and whole grains.  - avoid sweets, white starchy foods, fried foods, fast food, processed foods, sodas, red meet and other fattening foods.  - get a least 150-300 minutes of aerobic exercise per week.        Valerie Nguyen R.

## 2015-06-20 NOTE — Patient Instructions (Signed)
Before you leave: -Labs -Schedule follow-up in 4-6 months  AmLactin foot cream nightly, please gently exfoliate calluses in the shower.  Please get your diabetic eye exam as scheduled, and have your eye doctor fax the report to our office.  Please call today to schedule your mammogram  We recommend the following healthy lifestyle measures: - eat a healthy whole foods diet consisting of regular small meals composed of vegetables, fruits, beans, nuts, seeds, healthy meats such as white chicken and fish and whole grains.  - avoid sweets, white starchy foods, fried foods, fast food, processed foods, sodas, red meet and other fattening foods.  - get a least 150-300 minutes of aerobic exercise per week.   We recommend the following healthy lifestyle measures: - eat a healthy whole foods diet consisting of regular small meals composed of vegetables, fruits, beans, nuts, seeds, healthy meats such as white chicken and fish and whole grains.  - avoid sweets, white starchy foods, fried foods, fast food, processed foods, sodas, red meet and other fattening foods.  - get a least 150-300 minutes of aerobic exercise per week.

## 2015-06-20 NOTE — Progress Notes (Signed)
Pre visit review using our clinic review tool, if applicable. No additional management support is needed unless otherwise documented below in the visit note. 

## 2015-07-18 ENCOUNTER — Encounter: Payer: Self-pay | Admitting: Vascular Surgery

## 2015-07-23 ENCOUNTER — Encounter: Payer: Self-pay | Admitting: Vascular Surgery

## 2015-07-23 ENCOUNTER — Ambulatory Visit (INDEPENDENT_AMBULATORY_CARE_PROVIDER_SITE_OTHER): Payer: Medicare HMO | Admitting: Vascular Surgery

## 2015-07-23 VITALS — BP 135/72 | HR 62 | Temp 98.1°F | Resp 16 | Ht 63.0 in | Wt 159.0 lb

## 2015-07-23 DIAGNOSIS — I83891 Varicose veins of right lower extremities with other complications: Secondary | ICD-10-CM | POA: Diagnosis not present

## 2015-07-23 NOTE — Progress Notes (Signed)
Problems with Activities of Daily Living Secondary to Leg Pain  1. 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    2. [name] states that activities that require prolonged standing (cooking, cleaning, shopping) are difficult due to leg pain.       Failure of  Conservative Therapy:  1. Worn 20-30 mm Hg thigh high compression hose >3 months with no relief of symptoms.  2. Frequently elevates legs-no relief of symptoms  3. Taken Ibuprofen 600 Mg TID with no relief of symptoms.    [facility] of [facility]  Patient name: Valerie Nguyen MRN: [id number] DOB: 05/30/1940 Sex: female  REASON FOR VISIT: 75 year old discussion of dramatic right leg venous hypertension.  HPI: Valerie Nguyen is a 75 y.o. female patient is here today for 3 month follow-up. Had seen Dr. Oneida Alar 3 months ago was placed in thigh-high graduated compression garments. She continues to have difficulty related to her venous hypertension. She doesn't radial standing in her occupation and has pain associated with this. Dorsally she's had no skin breakdown or venous ulceration.  Past Medical History  Diagnosis Date  . Hypertension   . High cholesterol   . CKD (chronic kidney disease) 03/24/2012  . GERD (gastroesophageal reflux disease) 03/24/2012  . Lower extremity edema 03/24/2012  . Borderline diabetes 06/22/2012  . Varicose veins     Family History  Problem Relation Age of Onset  . Breast cancer Mother   . Hypertension Mother   . Alzheimer's disease Mother   . Hypertension Father     SOCIAL HISTORY: Social History  Substance Use Topics  . Smoking status: Never Smoker   . Smokeless tobacco: Never Used  . Alcohol Use: No    No Known Allergies  Current Outpatient Prescriptions  Medication Sig Dispense Refill  . aspirin 81 MG tablet Take 81 mg by mouth daily.    . famotidine (PEPCID) 20 MG tablet Take 20 mg by mouth daily.    [name]  lisinopril-hydrochlorothiazide (PRINZIDE,ZESTORETIC) 10-12.5 MG tablet TAKE 1 TABLET BY MOUTH DAILY. 90 tablet 1  . metoprolol tartrate (LOPRESSOR) 25 MG tablet TAKE 1 TABLET (25 MG TOTAL) BY MOUTH DAILY. 90 tablet 1  . simvastatin (ZOCOR) 40 MG tablet TAKE 1 TABLET BY MOUTH ATBEDTIME 90 tablet 1   No current facility-administered medications for this visit.    REVIEW OF SYSTEMS:  [X]  denotes positive finding, [ ]  denotes negative finding Cardiac  Comments:  Chest pain or chest pressure:    Shortness of breath upon exertion:    Short of breath when lying flat:    Irregular heart rhythm:        Vascular    Pain in calf, thigh, or hip brought on by ambulation:    Pain in feet at night that wakes you up from your sleep:     Blood clot in your veins:    Leg swelling:         Pulmonary    Oxygen at home:    Productive cough:     Wheezing:         Neurologic    Sudden weakness in arms or legs:     Sudden numbness in arms or legs:     Sudden onset of difficulty speaking or slurred speech:    Temporary loss of vision in one eye:     Problems with dizziness:         Gastrointestinal  Blood in stool:     Vomited blood:         Genitourinary    Burning when urinating:     Blood in urine:        Psychiatric    Major depression:         Hematologic    Bleeding problems:    Problems with blood clotting too easily:        Skin    Rashes or ulcers:        Constitutional    Fever or chills:      PHYSICAL EXAM: Filed Vitals:   07/23/15 1007  BP: 135/72  Pulse: 62  Temp: 98.1 F (36.7 C)  TempSrc: Oral  Resp: 16  Height: 5\' 3"  (1.6 m)  Weight: 159 lb (72.122 kg)  SpO2: 99%    GENERAL: The patient is a well-nourished female, in no acute distress. The vital signs are documented above.  VASCULAR: Palpable dorsalis pedis pulses bilaterally PULMONARY: There is good air exchange  MUSCULOSKELETAL: There are no major deformities or cyanosis. NEUROLOGIC: No focal  weakness or paresthesias are detected. SKIN: There are no ulcers or rashes noted. PSYCHIATRIC: The patient has a normal affect. 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 Marked swelling is chronic around her right distal calf and ankle onto her foot with extensive telangiectasia present  DATA:  Formal venous duplex showed extensive varicosities arising from a very large refluxing great saphenous vein. 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 Have recommended laser ablation of her great saphenous vein. I suspect that she will require staged stab phlebectomy of these very large varicosities. Explained that Medicare requires this. A staged versus simultaneous treatment. We'll proceed with laser ablation of her right great saphenous vein at her earliest convenience    [name] [facility] [employer]: [phone]

## 2015-08-14 ENCOUNTER — Encounter: Payer: Self-pay | Admitting: Vascular Surgery

## 2015-08-22 ENCOUNTER — Ambulatory Visit (INDEPENDENT_AMBULATORY_CARE_PROVIDER_SITE_OTHER): Payer: Medicare HMO | Admitting: Vascular Surgery

## 2015-08-22 ENCOUNTER — Encounter: Payer: Self-pay | Admitting: Vascular Surgery

## 2015-08-22 VITALS — BP 131/69 | HR 59 | Temp 97.0°F | Resp 16 | Ht 64.0 in | Wt 160.0 lb

## 2015-08-22 DIAGNOSIS — I83891 Varicose veins of right lower extremities with other complications: Secondary | ICD-10-CM

## 2015-08-22 HISTORY — PX: ENDOVENOUS ABLATION SAPHENOUS VEIN W/ LASER: SUR449

## 2015-08-22 NOTE — Progress Notes (Signed)
     Laser Ablation Procedure    Date: 08/22/2015   Currie Brungard DOB:02-02-1941  Consent signed: Yes    Surgeon:  Dr. Sherren Mocha Kyndall Chaplin  Procedure: Laser Ablation: right Greater Saphenous Vein  BP 131/69 mmHg  Pulse 59  Temp(Src) 97 F (36.1 C)  Resp 16  Ht 5\' 4"  (1.626 m)  Wt 160 lb (72.576 kg)  BMI 27.45 kg/m2  SpO2 98%  Tumescent Anesthesia: 450 cc 0.9% NaCl with 50 cc Lidocaine HCL with 1% Epi and 15 cc 8.4% NaHCO3  Local Anesthesia: 3 cc Lidocaine HCL and NaHCO3 (ratio 2:1)  15 watts continuous mode        Total energy: 2885 Joules   Total time: 3:12      Patient tolerated procedure well    Description of Procedure:  After marking the course of the secondary varicosities, the patient was placed on the operating table in the supine position, and the right leg was prepped and draped in sterile fashion.   Local anesthetic was administered and under ultrasound guidance the saphenous vein was accessed with a micro needle and guide wire; then the mirco puncture sheath was placed.  A guide wire was inserted saphenofemoral junction , followed by a 5 french sheath.  The position of the sheath and then the laser fiber below the junction was confirmed using the ultrasound.  Tumescent anesthesia was administered along the course of the saphenous vein using ultrasound guidance. The patient was placed in Trendelenburg position and protective laser glasses were placed on patient and staff, and the laser was fired at 15 watts continuous mode advancing 1-36mm/second for a total of 2885 joules.       Steri strips were applied and ABD pads and thigh high compression stockings were applied.  Ace wrap bandages were applied at the top of the saphenofemoral junction. Blood loss was less than 15 cc.  The patient ambulated out of the operating room having tolerated the procedure well.  Laser ablation from mid calf to just below saphenofemoral junction. Has extremely large varices in her medial knee  area and also in her calf which may require staged phlebectomy.

## 2015-08-23 ENCOUNTER — Telehealth: Payer: Self-pay | Admitting: *Deleted

## 2015-08-23 NOTE — Telephone Encounter (Signed)
    08/23/2015  Time: 9:15 AM   Patient Name: Valerie Nguyen  Patient of: T.F. Early  Procedure:Laser Ablation right greater saphenous vein 08-22-2015    Reached patient at home and checked  Her status  Yes    Comments/Actions Taken: [name] states she has minimal right leg discomfort and no swelling of right leg.  States she slept well last night.  Reviewed post procedural instructions with her and reminded her of post laser ablation duplex on 08-29-2015 and VV follow up appointment on 09-05-2015.      @SIGNATURE @

## 2015-08-26 ENCOUNTER — Other Ambulatory Visit: Payer: Self-pay | Admitting: *Deleted

## 2015-08-26 DIAGNOSIS — I83891 Varicose veins of right lower extremities with other complications: Secondary | ICD-10-CM

## 2015-08-29 ENCOUNTER — Encounter: Payer: Self-pay | Admitting: Vascular Surgery

## 2015-08-29 ENCOUNTER — Ambulatory Visit (HOSPITAL_COMMUNITY)
Admission: RE | Admit: 2015-08-29 | Discharge: 2015-08-29 | Disposition: A | Discharge status: Home / Self Care | Payer: Medicare HMO | Source: Ambulatory Visit | Attending: Vascular Surgery | Admitting: Vascular Surgery

## 2015-08-29 DIAGNOSIS — E1122 Type 2 diabetes mellitus with diabetic chronic kidney disease: Secondary | ICD-10-CM | POA: Insufficient documentation

## 2015-08-29 DIAGNOSIS — Z9889 Other specified postprocedural states: Secondary | ICD-10-CM | POA: Diagnosis not present

## 2015-08-29 DIAGNOSIS — E7800 Pure hypercholesterolemia, unspecified: Secondary | ICD-10-CM | POA: Diagnosis not present

## 2015-08-29 DIAGNOSIS — N189 Chronic kidney disease, unspecified: Secondary | ICD-10-CM | POA: Insufficient documentation

## 2015-08-29 DIAGNOSIS — I82811 Embolism and thrombosis of superficial veins of right lower extremities: Secondary | ICD-10-CM | POA: Insufficient documentation

## 2015-08-29 DIAGNOSIS — I83891 Varicose veins of right lower extremities with other complications: Secondary | ICD-10-CM | POA: Insufficient documentation

## 2015-08-29 DIAGNOSIS — I129 Hypertensive chronic kidney disease with stage 1 through stage 4 chronic kidney disease, or unspecified chronic kidney disease: Secondary | ICD-10-CM | POA: Insufficient documentation

## 2015-09-05 ENCOUNTER — Ambulatory Visit (INDEPENDENT_AMBULATORY_CARE_PROVIDER_SITE_OTHER): Payer: Medicare HMO | Admitting: Vascular Surgery

## 2015-09-05 ENCOUNTER — Encounter: Payer: Self-pay | Admitting: Vascular Surgery

## 2015-09-05 VITALS — BP 126/71 | HR 59 | Ht 64.0 in | Wt 158.0 lb

## 2015-09-05 DIAGNOSIS — I868 Varicose veins of other specified sites: Secondary | ICD-10-CM

## 2015-09-05 DIAGNOSIS — I83891 Varicose veins of right lower extremities with other complications: Secondary | ICD-10-CM

## 2015-09-05 NOTE — Progress Notes (Signed)
[facility] and [facility] Specialist of [facility]  Patient name: Valerie Nguyen MRN: [id number] DOB: 06-Jul-1940 Sex: female  REASON FOR VISIT: Follow-up of laser ablation of right great saphenous vein on 08/22/2015  HPI: Valerie Nguyen is a 75 y.o. female here today for follow-up. Is doing very well since her surgery. Reports mild discomfort and mild bruising. She has been compliant with her graduated compression garments.  Past Medical History  Diagnosis Date  . Hypertension   . High cholesterol   . CKD (chronic kidney disease) 03/24/2012  . GERD (gastroesophageal reflux disease) 03/24/2012  . Lower extremity edema 03/24/2012  . Borderline diabetes 06/22/2012  . Varicose veins     Family History  Problem Relation Age of Onset  . Breast cancer Mother   . Hypertension Mother   . Alzheimer's disease Mother   . Hypertension Father     SOCIAL HISTORY: Social History  Substance Use Topics  . Smoking status: Never Smoker   . Smokeless tobacco: Never Used  . Alcohol Use: No    No Known Allergies  Current Outpatient Prescriptions  Medication Sig Dispense Refill  . aspirin 81 MG tablet Take 81 mg by mouth daily.    . famotidine (PEPCID) 20 MG tablet Take 20 mg by mouth daily.    [name] lisinopril-hydrochlorothiazide (PRINZIDE,ZESTORETIC) 10-12.5 MG tablet TAKE 1 TABLET BY MOUTH DAILY. 90 tablet 1  . metoprolol tartrate (LOPRESSOR) 25 MG tablet TAKE 1 TABLET (25 MG TOTAL) BY MOUTH DAILY. 90 tablet 1  . simvastatin (ZOCOR) 40 MG tablet TAKE 1 TABLET BY MOUTH ATBEDTIME 90 tablet 1   No current facility-administered medications for this visit.    REVIEW OF SYSTEMS:  [X]  denotes positive finding, [ ]  denotes negative finding Cardiac  Comments:  Chest pain or chest pressure:    Shortness of breath upon exertion:    Short of breath when lying flat:    Irregular heart rhythm:        Vascular    Pain in calf, thigh, or hip brought on by ambulation:    Pain in  feet at night that wakes you up from your sleep:     Blood clot in your veins:    Leg swelling:         Pulmonary    Oxygen at home:    Productive cough:     Wheezing:         Neurologic    Sudden weakness in arms or legs:     Sudden numbness in arms or legs:     Sudden onset of difficulty speaking or slurred speech:    Temporary loss of vision in one eye:     Problems with dizziness:         Gastrointestinal    Blood in stool:     Vomited blood:         Genitourinary    Burning when urinating:     Blood in urine:        Psychiatric    Major depression:         Hematologic    Bleeding problems:    Problems with blood clotting too easily:        Skin    Rashes or ulcers:        Constitutional    Fever or chills:      PHYSICAL EXAM: Filed Vitals:   09/05/15 1010  BP: 126/71  Pulse: 59  Height: 5\' 4"  (1.626 m)  Weight: 158 lb (71.668  kg)  SpO2: 97%    GENERAL: The patient is a well-nourished female, in no acute distress. The vital signs are documented above.  VASCULAR: 2+ dorsalis pedis pulse on the right. Minimal bruising in her right thigh. Easily palpable cord in her thrombosed saphenous vein from the knee through her thigh. MUSCULOSKELETAL: There are no major deformities or cyanosis. NEUROLOGIC: No focal weakness or paresthesias are detected. SKIN: There are no ulcers or rashes noted. PSYCHIATRIC: The patient has a normal affect.  DATA:  Duplex from last week was reviewed with the patient. This shows a closure of her great saphenous vein from the distal insertion site to the saphenofemoral junction with no evidence of DVT  MEDICAL ISSUES: Excellent early result from laser ablation of great saphenous vein. She will continue her compression on as-needed basis. She does report this is improving her swelling and symptoms and therefore will continue this. Is quite pleased with a dramatic improvement in her discomfort and lower from any swelling. Will be seen  again on an as-needed basis    Rosetta Posner, MD [facility] Tel [phone] Pager [phone]

## 2015-09-14 ENCOUNTER — Other Ambulatory Visit: Payer: Self-pay | Admitting: Family Medicine

## 2015-12-20 ENCOUNTER — Ambulatory Visit: Payer: Medicare HMO | Admitting: Family Medicine

## 2016-03-28 ENCOUNTER — Other Ambulatory Visit: Payer: Self-pay | Admitting: Family Medicine

## 2016-07-15 ENCOUNTER — Other Ambulatory Visit: Payer: Self-pay | Admitting: Family Medicine

## 2016-08-12 NOTE — Progress Notes (Signed)
HPI:  Valerie Nguyen is a pleasant 76 y.o. here for follow up. Chronic medical problems summarized below were reviewed for changes and stability and were updated as needed below. These issues and their treatment remain stable for the most part. Admits lots of sugar - soda and candy daily. No regular exercise. Agrees to schedule eye exam and mammogram. Denies CP, SOB, DOE, dizziness, treatment intolerance or new symptoms.  Diet controlled DM: -w/ mild CKD -meds: asa -lifestlye: diet so so  HTN with diabetes: -meds: lisinopril-hctz, metoprolol tartrate -hx labile elevated HR and ups and downs in BP  HLD: -meds: simvastatin -stable  GERD: -meds: pepcid -stable  Varicose veins/Thrombophlebitis: -seeing Dr. Sherren Mocha Early  ROS: See pertinent positives and negatives per HPI.  Past Medical History:  Diagnosis Date  . Borderline diabetes 06/22/2012  . CKD (chronic kidney disease) 03/24/2012  . GERD (gastroesophageal reflux disease) 03/24/2012  . High cholesterol   . Hypertension   . Lower extremity edema 03/24/2012  . Varicose veins     Past Surgical History:  Procedure Laterality Date  . ABDOMINAL HYSTERECTOMY  1987  . ENDOVENOUS ABLATION SAPHENOUS VEIN W/ LASER Right 08-22-2015   endovenous laser ablation right greater saphenous vein by Curt Jews MD     Family History  Problem Relation Age of Onset  . Breast cancer Mother   . Hypertension Mother   . Alzheimer's disease Mother   . Hypertension Father     Social History   Social History  . Marital status: Married    Spouse name: N/A  . Number of children: N/A  . Years of education: N/A   Social History Main Topics  . Smoking status: Never Smoker  . Smokeless tobacco: Never Used  . Alcohol use No  . Drug use: No  . Sexual activity: Not Asked   Other Topics Concern  . None   Social History Narrative  . None     Current Outpatient Prescriptions:  .  aspirin 81 MG tablet, Take 81 mg by mouth daily., Disp: ,  Rfl:  .  famotidine (PEPCID) 20 MG tablet, Take 20 mg by mouth daily., Disp: , Rfl:  .  lisinopril-hydrochlorothiazide (PRINZIDE,ZESTORETIC) 10-12.5 MG tablet, Take 1 tablet by mouth daily., Disp: 90 tablet, Rfl: 3 .  metoprolol tartrate (LOPRESSOR) 25 MG tablet, TAKE 1 TABLET (25 MG TOTAL) BY MOUTH DAILY., Disp: 90 tablet, Rfl: 3 .  simvastatin (ZOCOR) 40 MG tablet, Take 1 tablet (40 mg total) by mouth at bedtime., Disp: 90 tablet, Rfl: 3  EXAM:  Vitals:   08/13/16 1011  BP: 102/60  Pulse: 60  Temp: 97.8 F (36.6 C)    Body mass index is 27.17 kg/m.  GENERAL: vitals reviewed and listed above, alert, oriented, appears well hydrated and in no acute distress  HEENT: atraumatic, conjunttiva clear, no obvious abnormalities on inspection of external nose and ears  NECK: no obvious masses on inspection  LUNGS: clear to auscultation bilaterally, no wheezes, rales or rhonchi, good air movement  CV: HRRR, no peripheral edema  MS: moves all extremities without noticeable abnormality  PSYCH: pleasant and cooperative, no obvious depression or anxiety  ASSESSMENT AND PLAN:  Discussed the following assessment and plan:  Diet-controlled diabetes mellitus ([place]) - Plan: Hemoglobin A1c, Ambulatory referral to Podiatry  Hyperlipidemia associated with type 2 diabetes mellitus ([place])  Hypertension associated with diabetes ([place]) - Plan: Basic metabolic panel, CBC  Onychomycosis - Plan: Ambulatory referral to Podiatry  Callus - Plan: Ambulatory referral to Podiatry  -  labs today -foot exam done - onychomycosis and callus - referred to podiatry and foot care discussed -lower BP and HR - opted to monitor, consider reduction BB - she has had elevated HR and labile BP in the past -lifestyle recs, advised reducing sugar -advised to schedule mammo and eye exam -AWV/CPE in September -Patient advised to return or notify a doctor immediately if symptoms worsen or persist or new concerns  arise.  Patient Instructions  BEFORE YOU LEAVE: -follow up: CPE with Dr. Thane Edu with [facility] in September - come fasting if possible -labs  Foot care as we discussed  Diabetic eye exam  Mammogram  -We placed a referral for you as discussed to the foot doctor. It usually takes about 1-2 weeks to process and schedule this referral. If you have not heard from [place] regarding this appointment in 2 weeks please contact our office.  Advise regular aerobic exercise (at least 150 minutes per week of sweaty exercise) and a healthy diet. Try to eat at least 5-9 servings of vegetables and fruits per day (not corn, potatoes or bananas.) Avoid sweets, red meat, pork, butter, fried foods, fast food, processed food, excessive dairy, eggs and coconut. Replace bad fats with good fats - fish, nuts and seeds, canola oil, olive oil.     Colin Benton R., DO

## 2016-08-13 ENCOUNTER — Ambulatory Visit (INDEPENDENT_AMBULATORY_CARE_PROVIDER_SITE_OTHER): Payer: Medicare HMO | Admitting: Family Medicine

## 2016-08-13 ENCOUNTER — Encounter: Payer: Self-pay | Admitting: Family Medicine

## 2016-08-13 VITALS — BP 102/60 | HR 60 | Temp 97.8°F | Ht 64.0 in | Wt 158.3 lb

## 2016-08-13 DIAGNOSIS — E119 Type 2 diabetes mellitus without complications: Secondary | ICD-10-CM

## 2016-08-13 DIAGNOSIS — E1169 Type 2 diabetes mellitus with other specified complication: Secondary | ICD-10-CM | POA: Diagnosis not present

## 2016-08-13 DIAGNOSIS — E785 Hyperlipidemia, unspecified: Secondary | ICD-10-CM

## 2016-08-13 DIAGNOSIS — L84 Corns and callosities: Secondary | ICD-10-CM

## 2016-08-13 DIAGNOSIS — E1159 Type 2 diabetes mellitus with other circulatory complications: Secondary | ICD-10-CM | POA: Diagnosis not present

## 2016-08-13 DIAGNOSIS — I152 Hypertension secondary to endocrine disorders: Secondary | ICD-10-CM

## 2016-08-13 DIAGNOSIS — B351 Tinea unguium: Secondary | ICD-10-CM

## 2016-08-13 DIAGNOSIS — I10 Essential (primary) hypertension: Secondary | ICD-10-CM | POA: Diagnosis not present

## 2016-08-13 LAB — BASIC METABOLIC PANEL
BUN: 23 mg/dL (ref 6–23)
CHLORIDE: 104 meq/L (ref 96–112)
CO2: 28 meq/L (ref 19–32)
Calcium: 9.8 mg/dL (ref 8.4–10.5)
Creatinine, Ser: 1.2 mg/dL (ref 0.40–1.20)
GFR: 46.46 mL/min — ABNORMAL LOW (ref >60.00)
GLUCOSE: 108 mg/dL — AB (ref 70–99)
POTASSIUM: 4.1 meq/L (ref 3.5–5.1)
SODIUM: 138 meq/L (ref 135–145)

## 2016-08-13 LAB — CBC
HEMATOCRIT: 35.2 % — AB (ref 36.0–46.0)
HEMOGLOBIN: 11.9 g/dL — AB (ref 12.0–15.0)
MCHC: 33.8 g/dL (ref 30.0–36.0)
MCV: 89.7 fl (ref 78.0–100.0)
PLATELETS: 236 10*3/uL (ref 150.0–400.0)
RBC: 3.92 Mil/uL (ref 3.87–5.11)
RDW: 13.1 % (ref 11.5–15.5)
WBC: 8 10*3/uL (ref 4.0–10.5)

## 2016-08-13 LAB — HEMOGLOBIN A1C: Hgb A1c MFr Bld: 6.6 % — ABNORMAL HIGH (ref 4.6–6.5)

## 2016-08-13 MED ORDER — LISINOPRIL-HYDROCHLOROTHIAZIDE 10-12.5 MG PO TABS: 1.0000 | ORAL_TABLET | Freq: Every day | ORAL | 3 refills | Status: DC

## 2016-08-13 MED ORDER — METOPROLOL TARTRATE 25 MG PO TABS: ORAL_TABLET | ORAL | 3 refills | Status: DC

## 2016-08-13 MED ORDER — SIMVASTATIN 40 MG PO TABS: 40.0000 mg | ORAL_TABLET | Freq: Every day | ORAL | 3 refills | Status: DC

## 2016-08-13 NOTE — Patient Instructions (Signed)
BEFORE YOU LEAVE: -follow up: CPE with Dr. Thane Edu with [facility] in September - come fasting if possible -labs  Foot care as we discussed  Diabetic eye exam  Mammogram  -We placed a referral for you as discussed to the foot doctor. It usually takes about 1-2 weeks to process and schedule this referral. If you have not heard from [place] regarding this appointment in 2 weeks please contact our office.  Advise regular aerobic exercise (at least 150 minutes per week of sweaty exercise) and a healthy diet. Try to eat at least 5-9 servings of vegetables and fruits per day (not corn, potatoes or bananas.) Avoid sweets, red meat, pork, butter, fried foods, fast food, processed food, excessive dairy, eggs and coconut. Replace bad fats with good fats - fish, nuts and seeds, canola oil, olive oil.

## 2016-08-17 ENCOUNTER — Encounter: Payer: Self-pay | Admitting: Family Medicine

## 2016-08-17 DIAGNOSIS — R7989 Other specified abnormal findings of blood chemistry: Secondary | ICD-10-CM | POA: Insufficient documentation

## 2016-08-19 ENCOUNTER — Other Ambulatory Visit: Payer: Self-pay | Admitting: Family Medicine

## 2016-08-19 DIAGNOSIS — Z1231 Encounter for screening mammogram for malignant neoplasm of breast: Secondary | ICD-10-CM

## 2016-09-02 ENCOUNTER — Ambulatory Visit
Admission: RE | Admit: 2016-09-02 | Discharge: 2016-09-02 | Disposition: A | Discharge status: Home / Self Care | Payer: Medicare HMO | Source: Ambulatory Visit | Attending: Family Medicine | Admitting: Family Medicine

## 2016-09-02 DIAGNOSIS — Z1231 Encounter for screening mammogram for malignant neoplasm of breast: Secondary | ICD-10-CM

## 2016-11-26 DIAGNOSIS — Z961 Presence of intraocular lens: Secondary | ICD-10-CM | POA: Diagnosis not present

## 2016-11-26 DIAGNOSIS — H5213 Myopia, bilateral: Secondary | ICD-10-CM | POA: Diagnosis not present

## 2016-11-26 DIAGNOSIS — E119 Type 2 diabetes mellitus without complications: Secondary | ICD-10-CM | POA: Diagnosis not present

## 2016-11-26 LAB — HM DIABETES EYE EXAM

## 2016-12-10 ENCOUNTER — Encounter: Payer: Self-pay | Admitting: Family Medicine

## 2016-12-16 NOTE — Progress Notes (Signed)
HPI:  Here for CPE:  -Concerns and/or follow up today:  Seeing Manuela Schwartz later today for her AWV.  Diet controlled DM: -w/ mild CKD -meds: asa -lifestlye: diet so so  HTN with diabetes: -meds: lisinopril-hctz, metoprolol tartrate -hx labile elevated HR and ups and downs in BP  HLD: -meds: simvastatin -stable  GERD: -meds: pepcid -stable fo a long time  Varicose veins/Thrombophlebitis: -seeing Dr. Sherren Mocha Early  Seeing Epifania Gore for Medicare Wellness visit today.  -Alcohol, Tobacco, drug use: see social history  Review of Systems - no fevers, unintentional weight loss, vision loss, hearing loss, chest pain, sob, hemoptysis, melena, hematochezia, hematuria, genital discharge, changing or concerning skin lesions, bleeding, bruising, loc, thoughts of self harm or SI  Past Medical History:  Diagnosis Date  . Borderline diabetes 06/22/2012  . CKD (chronic kidney disease) 03/24/2012  . GERD (gastroesophageal reflux disease) 03/24/2012  . High cholesterol   . Hypertension   . Lower extremity edema 03/24/2012  . Varicose veins     Past Surgical History:  Procedure Laterality Date  . ABDOMINAL HYSTERECTOMY  1987  . BREAST EXCISIONAL BIOPSY Left   . ENDOVENOUS ABLATION SAPHENOUS VEIN W/ LASER Right 08-22-2015   endovenous laser ablation right greater saphenous vein by Curt Jews MD     Family History  Problem Relation Age of Onset  . Breast cancer Mother   . Hypertension Mother   . Alzheimer's disease Mother   . Hypertension Father     Social History   Social History  . Marital status: Married    Spouse name: N/A  . Number of children: N/A  . Years of education: N/A   Social History Main Topics  . Smoking status: Never Smoker  . Smokeless tobacco: Never Used  . Alcohol use No  . Drug use: No  . Sexual activity: Not Asked   Other Topics Concern  . None   Social History Narrative  . None     Current Outpatient Prescriptions:  .  aspirin 81 MG tablet,  Take 81 mg by mouth daily., Disp: , Rfl:  .  lisinopril-hydrochlorothiazide (PRINZIDE,ZESTORETIC) 10-12.5 MG tablet, Take 1 tablet by mouth daily., Disp: 90 tablet, Rfl: 3 .  metoprolol tartrate (LOPRESSOR) 25 MG tablet, TAKE 1 TABLET (25 MG TOTAL) BY MOUTH DAILY., Disp: 90 tablet, Rfl: 3 .  simvastatin (ZOCOR) 40 MG tablet, Take 1 tablet (40 mg total) by mouth at bedtime., Disp: 90 tablet, Rfl: 3  EXAM:  Vitals:   12/17/16 0655  BP: 120/74  Pulse: 68  Temp: 98.4 F (36.9 C)    GENERAL: vitals reviewed and listed below, alert, oriented, appears well hydrated and in no acute distress  HEENT: head atraumatic, PERRLA, normal appearance of eyes, ears, nose and mouth. moist mucus membranes.  NECK: supple, no masses or lymphadenopathy  LUNGS: clear to auscultation bilaterally, no rales, rhonchi or wheeze  CV: HRRR, no peripheral edema or cyanosis, normal pedal pulses  ABDOMEN: bowel sounds normal, soft, non tender to palpation, no masses, no rebound or guarding  SKIN: no rash or abnormal lesions  MS: normal gait, moves all extremities normally  NEURO: normal gait, speech and thought processing grossly intact, muscle tone grossly intact throughout  GU/BREAST: declined  PSYCH: normal affect, pleasant and cooperative  ASSESSMENT AND PLAN:  Discussed the following assessment and plan:  Encounter for preventative adult health care examination -Breifly reviewed [place] preventive services health task force level A and B recommendations for age, sex and risks. She will discuss  preventive care recs with Epifania Gore today at [facility] -she had told us in the past she was seeing [facility] and was UTD on colon cancer screening, today she reports she did not follow up after last reminder for repeat colonoscopy - so sounds like she missed final screening and had hx of polyps -she prefers referral to [place] rather then following up with [facility] to discuss options given her age and ? Incomplete  screening - advised assistant to obtain prior report and also referred to [facility] per their request -Advised at least 150 minutes of exercise per week and a healthy diet. -labs, studies and vaccines per orders this encounter Screening for depression  Diet-controlled diabetes mellitus ([place]) - Plan: Hemoglobin A1c  Diabetic nephropathy associated with type 2 diabetes mellitus ([place]) -lifestyle recs -labs -cont asa, bb, acei  Essential hypertension - Plan: Basic metabolic panel, CBC with Differential/Platelet -cont current tx, lifestyle recs, labs  Hyperlipidemia, unspecified hyperlipidemia type -cont current tx -lifestyle recs, labs  Gastroesophageal reflux disease, esophagitis presence not specified -stop PPI - she is to call if GERD returns  Orders Placed This Encounter  Procedures  . Flu vaccine HIGH DOSE PF (Fluzone High dose)  . Basic metabolic panel  . CBC with Differential/Platelet  . Hemoglobin A1c  . Ambulatory referral to Gastroenterology    Referral Priority:   Routine    Referral Type:   Consultation    Referral Reason:   Specialty Services Required    Number of Visits Requested:   1    Patient advised to return to clinic immediately if symptoms worsen or persist or new concerns.  Patient Instructions  BEFORE YOU LEAVE: -obtain and abstract eye exam and last colonoscopy ([facility]) -labs -flu shot -AWV with Manuela Schwartz later today -follow up: with Dr. Maudie Mercury in 3-4 months  -We placed a referral for you as discussed to [place] regarding colon cancer screening. It usually takes about 1-2 weeks to process and schedule this referral. If you have not heard from [place] regarding this appointment in 2 weeks please contact our office.  -Vit D3 [phone] IU daily  -STOP the Pepcid - let us know if acid reflux returns  -Advise regular aerobic exercise (at least 150 minutes per week of sweaty exercise) and a healthy diet. Try to eat at least 5-9 servings of vegetables and fruits  per day (not corn, potatoes or bananas.) Avoid sweets, red meat, pork, butter, fried foods, fast food, processed food, excessive dairy, eggs and coconut. Replace bad fats with good fats - fish, nuts and seeds, canola oil, olive oil.       No Follow-up on file.  Colin Benton R., DO

## 2016-12-16 NOTE — Progress Notes (Signed)
Subjective:   Valerie Nguyen is a 76 y.o. female who presents for Medicare Annual (Subsequent) preventive examination.  The Patient was informed that the wellness visit is to identify future health risk and educate and initiate measures that can reduce risk for increased disease through the lifespan.    Annual Wellness Assessment  Reports health as good   Preventive Screening -Counseling & Management  Medicare Annual Preventive Care Visit - Subsequent Last OV 07/2016  Mammogram 08/2016 - Dexa 12/2009; No report - [facility] to [facility] report for Dr. Maudie Nguyen Family called Valerie Nguyen in the office today   Ophthalmology Dr. Gershon Nguyen sent report in media Quick abstraction with no diabetic complications  Colonoscopy - contacted [facility] today when seen by Dr. Maudie Nguyen   Works Monday and Tuesday; 24 hours  Manages well without difficulty Lives with her dtr 1 girl and 2 boys Both brothers live on the same street Home is handicapped accessible  Bathrooms downstairs  Can add a little room off the family room, as a hospital bed  Family is prepared   VS reviewed;   Diet  Eats out a lot and cooks together Makes her own breakfast;  Has a fried egg and piece of toast; bacon Lunch sandwich; soup; pea soup Supper. Goes to [employer] to get the broccoli soup  BMI 28.4  Exercise- Works 12 hours and is on feet all day Has a treadmill; walks 30 minutes a day 3 to 4 times a week   Dental - yes; cap on the back but may have it pulled   Stressors: no   Sleep patterns: sleep well   Pain? None  No falls    Cardiac Risk Factors Addressed Hyperlipidemia - chol/hdl 4; chol 147; hdl 40; ldl 90; trig 87 Diabetes a1c 6.6 Educated on pre-diabetes    Advanced Directives completed   Patient Care Team: Valerie Kern, DO as PCP - General (Family Medicine) Valerie Sprang, MD as Consulting Physician (Cardiology)     Cardiac Risk Factors include: advanced age (>60men, >61 women);family history of  premature cardiovascular disease;hypertension     Objective:     Vitals: BP 134/84   Pulse 62   Ht 5\' 2"  (1.575 m)   Wt 155 lb (70.3 kg)   SpO2 95%   BMI 28.35 kg/m   Body mass index is 28.35 kg/m.   Tobacco History  Smoking Status  . Never Smoker  Smokeless Tobacco  . Never Used     Counseling given: Yes   Past Medical History:  Diagnosis Date  . Borderline diabetes 06/22/2012  . CKD (chronic kidney disease) 03/24/2012  . GERD (gastroesophageal reflux disease) 03/24/2012  . High cholesterol   . Hypertension   . Lower extremity edema 03/24/2012  . Varicose veins    Past Surgical History:  Procedure Laterality Date  . ABDOMINAL HYSTERECTOMY  1987  . BREAST EXCISIONAL BIOPSY Left   . ENDOVENOUS ABLATION SAPHENOUS VEIN W/ LASER Right 08-22-2015   endovenous laser ablation right greater saphenous vein by Curt Jews MD    Family History  Problem Relation Age of Onset  . Breast cancer Mother   . Hypertension Mother   . Alzheimer's disease Mother   . Hypertension Father    History  Sexual Activity  . Sexual activity: Not on file    Outpatient Encounter Prescriptions as of 12/17/2016  Medication Sig  . aspirin 81 MG tablet Take 81 mg by mouth daily.  [name] lisinopril-hydrochlorothiazide (PRINZIDE,ZESTORETIC) 10-12.5 MG tablet Take  1 tablet by mouth daily.  . metoprolol tartrate (LOPRESSOR) 25 MG tablet TAKE 1 TABLET (25 MG TOTAL) BY MOUTH DAILY.  . simvastatin (ZOCOR) 40 MG tablet Take 1 tablet (40 mg total) by mouth at bedtime.  . [DISCONTINUED] famotidine (PEPCID) 20 MG tablet Take 20 mg by mouth daily.   No facility-administered encounter medications on file as of 12/17/2016.     Activities of Daily Living In your present state of health, do you have any difficulty performing the following activities: 12/17/2016  Hearing? N  Vision? N  Difficulty concentrating or making decisions? N  Comment Ad8 score is 0   Walking or climbing stairs? N  Dressing or bathing? N    Doing errands, shopping? N  Preparing Food and eating ? N  Using the Toilet? N  In the past six months, have you accidently leaked urine? 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 N  Managing your Medications? N  Managing your Finances? N  Housekeeping or managing your Housekeeping? N  Some recent data might be hidden    Patient Care Team: Valerie Kern, DO as PCP - General (Family Medicine) Valerie Sprang, MD as Consulting Physician (Cardiology)    Assessment:     Exercise Activities and Dietary recommendations Current Exercise Habits: Home exercise routine, Time (Minutes): 30, Intensity: Moderate  Goals    . patient          Maintaining her health Taking care of grand children       Fall Risk Fall Risk  12/17/2016 08/13/2016 12/13/2014 11/08/2013 02/02/2013  Falls in the past year? No No No No No   Depression Screen PHQ 2/9 Scores 12/17/2016 12/17/2016 08/13/2016 12/13/2014  PHQ - 2 Score 0 0 0 0     Cognitive Function MMSE - Mini Mental State Exam 12/17/2016  Not completed: (No Data)        Immunization History  Administered Date(s) Administered  . Influenza, High Dose Seasonal PF 12/17/2016  . Pneumococcal Polysaccharide-23 07/08/2011  . Tdap 07/08/2011   Screening Tests Health Maintenance  Topic Date Due  . PNA vac Low Risk Adult (2 of 2 - PCV13) 06/12/2024 (Originally 07/07/2012)  . HEMOGLOBIN A1C  06/16/2017  . FOOT EXAM  08/13/2017  . MAMMOGRAM  09/02/2017  . OPHTHALMOLOGY EXAM  11/26/2017  . TETANUS/TDAP  07/07/2021  . INFLUENZA VACCINE  Completed  . DEXA SCAN  Completed      Plan:     PCP Notes   Health Maintenance Mammogram 08/2016 - Dexa 12/2009; No report - [facility] report for Dr. Maudie Nguyen Family called Valerie Nguyen in the office today   Ophthalmology Dr. Gershon Nguyen sent report in media Quick abstraction with no diabetic complications  Colonoscopy - contacted [facility] today when seen by  Dr. Maudie Nguyen   Abnormal Screens  Educated regarding pre diabetes   Referrals  None   Patient concerns; Has a prolapse uterus; states Dr Valerie Nguyen referred her out but then decided it may have been [facility]  Does not want major surgery;  Nurse Concerns; As noted   Next PCP apt Seen today       I have personally reviewed and noted the following in the patient's chart:   . Medical and social history . Use of alcohol, tobacco or illicit drugs  . Current medications and supplements . Functional ability and status . Nutritional status . Physical activity . Advanced directives .  List of other physicians . Hospitalizations, surgeries, and ER visits in previous 12 months . Vitals . Screenings to include cognitive, depression, and falls . Referrals and appointments  In addition, I have reviewed and discussed with patient certain preventive protocols, quality metrics, and best practice recommendations. A written personalized care plan for preventive services as well as general preventive health recommendations were provided to patient.     Wynetta Fines, RN  12/17/2016

## 2016-12-17 ENCOUNTER — Telehealth: Payer: Self-pay | Admitting: Family Medicine

## 2016-12-17 ENCOUNTER — Ambulatory Visit (INDEPENDENT_AMBULATORY_CARE_PROVIDER_SITE_OTHER): Payer: Medicare HMO

## 2016-12-17 ENCOUNTER — Ambulatory Visit (INDEPENDENT_AMBULATORY_CARE_PROVIDER_SITE_OTHER): Payer: Medicare HMO | Admitting: Family Medicine

## 2016-12-17 ENCOUNTER — Encounter: Payer: Self-pay | Admitting: Family Medicine

## 2016-12-17 VITALS — BP 134/84 | HR 62 | Ht 62.0 in | Wt 155.0 lb

## 2016-12-17 VITALS — BP 120/74 | HR 68 | Temp 98.4°F | Ht 61.5 in | Wt 154.9 lb

## 2016-12-17 DIAGNOSIS — E1121 Type 2 diabetes mellitus with diabetic nephropathy: Secondary | ICD-10-CM | POA: Diagnosis not present

## 2016-12-17 DIAGNOSIS — Z86010 Personal history of colonic polyps: Secondary | ICD-10-CM | POA: Diagnosis not present

## 2016-12-17 DIAGNOSIS — I10 Essential (primary) hypertension: Secondary | ICD-10-CM

## 2016-12-17 DIAGNOSIS — Z0000 Encounter for general adult medical examination without abnormal findings: Secondary | ICD-10-CM

## 2016-12-17 DIAGNOSIS — Z23 Encounter for immunization: Secondary | ICD-10-CM

## 2016-12-17 DIAGNOSIS — E119 Type 2 diabetes mellitus without complications: Secondary | ICD-10-CM | POA: Diagnosis not present

## 2016-12-17 DIAGNOSIS — E785 Hyperlipidemia, unspecified: Secondary | ICD-10-CM | POA: Diagnosis not present

## 2016-12-17 DIAGNOSIS — K219 Gastro-esophageal reflux disease without esophagitis: Secondary | ICD-10-CM | POA: Diagnosis not present

## 2016-12-17 DIAGNOSIS — Z1331 Encounter for screening for depression: Secondary | ICD-10-CM

## 2016-12-17 DIAGNOSIS — R7989 Other specified abnormal findings of blood chemistry: Secondary | ICD-10-CM

## 2016-12-17 DIAGNOSIS — Z1389 Encounter for screening for other disorder: Secondary | ICD-10-CM | POA: Diagnosis not present

## 2016-12-17 DIAGNOSIS — D649 Anemia, unspecified: Secondary | ICD-10-CM

## 2016-12-17 LAB — BASIC METABOLIC PANEL
BUN: 28 mg/dL — ABNORMAL HIGH (ref 6–23)
CHLORIDE: 104 meq/L (ref 96–112)
CO2: 27 mEq/L (ref 19–32)
Calcium: 9.9 mg/dL (ref 8.4–10.5)
Creatinine, Ser: 1.17 mg/dL (ref 0.40–1.20)
GFR: 47.79 mL/min — ABNORMAL LOW (ref >60.00)
GLUCOSE: 114 mg/dL — AB (ref 70–99)
POTASSIUM: 3.7 meq/L (ref 3.5–5.1)
SODIUM: 139 meq/L (ref 135–145)

## 2016-12-17 LAB — HEMOGLOBIN A1C: Hgb A1c MFr Bld: 6.4 % (ref 4.6–6.5)

## 2016-12-17 LAB — CBC WITH DIFFERENTIAL/PLATELET
BASOS ABS: 0 10*3/uL (ref 0.0–0.1)
Basophils Relative: 0.7 % (ref 0.0–3.0)
EOS ABS: 0.3 10*3/uL (ref 0.0–0.7)
EOS PCT: 5.3 % — AB (ref 0.0–5.0)
HEMATOCRIT: 35.5 % — AB (ref 36.0–46.0)
HEMOGLOBIN: 11.8 g/dL — AB (ref 12.0–15.0)
LYMPHS ABS: 2 10*3/uL (ref 0.7–4.0)
LYMPHS PCT: 31.7 % (ref 12.0–46.0)
MCHC: 33.2 g/dL (ref 30.0–36.0)
MCV: 91.5 fl (ref 78.0–100.0)
MONOS PCT: 12.5 % — AB (ref 3.0–12.0)
Monocytes Absolute: 0.8 10*3/uL (ref 0.1–1.0)
NEUTROS ABS: 3.2 10*3/uL (ref 1.4–7.7)
Neutrophils Relative %: 49.8 % (ref 43.0–77.0)
Platelets: 232 10*3/uL (ref 150.0–400.0)
RBC: 3.89 Mil/uL (ref 3.87–5.11)
RDW: 13 % (ref 11.5–15.5)
WBC: 6.4 10*3/uL (ref 4.0–10.5)

## 2016-12-17 NOTE — Patient Instructions (Addendum)
Ms. Hable , Thank you for taking time to come for your Medicare Wellness Visit. I appreciate your ongoing commitment to your health goals. Please review the following plan we discussed and let me know if I can assist you in the future.   [facility]  [address]  [place], [place] [address]  Phone [phone]  [place]; [email or web address]/ Monthly drawing/ potential patients can call [phone] to have an application mailed to their residence within 10 days.    Had eye exam Sept 6th; dr. Gershon Crane;   You can visit [email or web address]; search pre-diabetes   Dr. Gershon Crane to get your last eye report is filed in media; Cece Milhouse will abstract data   May call [facility] if you would like to find out your Tscore for your bone density (Dexa) done in 2011   These are the goals we discussed: Goals    . patient          Maintaining her health Taking care of grand children        This is a list of the screening recommended for you and due dates:  Health Maintenance  Topic Date Due  . Eye exam for diabetics  02/08/2015  . Pneumonia vaccines (2 of 2 - PCV13) 06/12/2024*  . Hemoglobin A1C  02/13/2017  . Complete foot exam   08/13/2017  . Mammogram  09/02/2017  . Tetanus Vaccine  07/07/2021  . Flu Shot  Completed  . DEXA scan (bone density measurement)  Completed  *Topic was postponed. The date shown is not the original due date.    Educated regarding prediabetes and numbers;  A1c ranges from 5.8 to 6.5 or fasting Blood sugar > 115 -126; (126 is diabetic)   Risk: >45yo; family hx; overweight or obese; African American; Hispanic; Latino; American [place] American; [place]; history of diabetes when pregnant; or birth to a baby weighing over 9 lbs. Being less physically active than 30 minutes; 3 times  a week;   Prevention; Losing a modest 7 to 8 lbs; If over 200 lbs; 10 to 14 lbs;  Choose healthier foods; colorful veggies; fish or lean meats; drinks water Reduce portion size Start exercising; 30 minutes of fast walking x 30 minutes per day/ 60 min for weight loss    Shingrix is a vaccine for the prevention of Shingles in Adults 50 and older.  If you are on Medicare, you can request a prescription from your doctor to be filled at a pharmacy.  Please check with your benefits regarding applicable copays or out of pocket expenses.  The Shingrix is given in 2 vaccines approx 8 weeks apart. You must receive the 2nd dose prior to 6 months from receipt of the first.        Screening for Type 2 Diabetes A screening test for type 2 diabetes (type 2 diabetes mellitus) is a blood test to measure your blood sugar (glucose) level. This test is done to check for early signs of diabetes, before you develop symptoms. Type 2 diabetes is a long-term (chronic) disease that occurs when the pancreas does not make enough of a hormone called insulin. This results in high blood glucose levels, which can cause many complications. You may be screened for type 2 diabetes as part of your regular health care, especially if you have a high risk for diabetes. Screening can help identify type 2 diabetes at its early stage (  prediabetes). Identifying and treating prediabetes may delay or prevent development of type 2 diabetes. What are the risk factors for type 2 diabetes? The following factors may make you more likely to develop type 2 diabetes:  Having a parent or sibling (first-degree relative) who has diabetes.  Being overweight or obese.  Being of American-Indian, [place], Hispanic, Latino, Asian, or African-American descent.  Not getting enough exercise.  Being older than 54.  Having a history of diabetes during pregnancy (gestational diabetes).  Having low levels of good cholesterol (HDL-C) or  high levels of blood fats (triglycerides).  Having high blood glucose in a previous blood test.  Having high blood pressure.  Having certain diseases or conditions, including: ? Acanthosis nigricans. This is a condition that causes dark skin on the neck, armpits, and groin. ? Polycystic ovary syndrome (PCOS). ? Heart disease.  Having delivered a baby who weighed more than 9 lb (4.1 kg).  Who should be screened for type 2 diabetes? Adults  Adults age 69 and older. These adults should be screened at least once every three years.  Adults who are younger than 63, overweight, and have at least one other risk factor. These adults should be screened at least once every three years.  Adults who have normal blood glucose levels and two or more risk factors. These adults may be screened once every year (annually).  Women who have had gestational diabetes in the past. These women should be screened at least once every three years.  Pregnant women who have risk factors. These women should be screened at their first prenatal visit.  Pregnant women with no risk factors. These women should be screened between weeks 24 and 28 of pregnancy. Children and adolescents  Children and adolescents should be screened for type 2 diabetes if they are overweight and have 2 of the following risk factors: ? A family history of type 2 diabetes. ? Being a member of a high risk race or ethnic group. ? Signs of insulin resistance or conditions associated with insulin resistance. ? A mother who had gestational diabetes while pregnant with him or her.  Screening should be done at least once every three years, starting at age 2. Your health care provider or your child's health care provider may recommend having a screening more or less often. What happens during screening? During screening, your health care provider may ask questions about:  Your health and your risk factors, including your activity level and  any medical conditions that you have.  The health of your first-degree relatives.  Past pregnancies, if this applies.  Your health care provider will also do a physical exam, including a blood pressure measurement and blood tests. There are four blood tests that can be used to screen for type 2 diabetes. You may have one or more of the following:  A fasting plasma glucose test (FBG). You will not be allowed to eat for at least eight hours before a blood sample is taken.  A random blood glucose test. This test checks your blood glucose at any time of the day regardless of when you ate.  An oral glucose tolerance test (OGTT). This test measures your blood glucose at two times: ? After you have not eaten (have fasted) overnight. ? Two hours after you drink a glucose-containing beverage. A diagnosis can be made if the level is greater than 200 mg/dL.  An A1c test. This test provides information about blood glucose control over the previous three months.  What do the results mean? Your test results are a measurement of how much glucose is in your blood. Normal blood glucose levels mean that you do not have diabetes or prediabetes. High blood glucose levels may mean that you have prediabetes or diabetes. Depending on the results, other tests may be needed to confirm the diagnosis. This information is not intended to replace advice given to you by your health care provider. Make sure you discuss any questions you have with your health care provider. Document Released: 01/03/2009 Document Revised: 08/15/2015 Document Reviewed: 01/04/2015 Elsevier Interactive Patient Education  2017 [place] Prevention in the Home Falls can cause injuries. They can happen to people of all ages. There are many things you can do to make your home safe and to help prevent falls. What can I do on the outside of my home?  Regularly fix the edges of walkways and driveways and fix any cracks.  Remove  anything that might make you trip as you walk through a door, such as a raised step or threshold.  Trim any bushes or trees on the path to your home.  Use bright outdoor lighting.  Clear any walking paths of anything that might make someone trip, such as rocks or tools.  Regularly check to see if handrails are loose or broken. Make sure that both sides of any steps have handrails.  Any raised decks and porches should have guardrails on the edges.  Have any leaves, snow, or ice cleared regularly.  Use sand or salt on walking paths during winter.  Clean up any spills in your garage right away. This includes oil or grease spills. What can I do in the bathroom?  Use night lights.  Install grab bars by the toilet and in the tub and shower. Do not use towel bars as grab bars.  Use non-skid mats or decals in the tub or shower.  If you need to sit down in the shower, use a plastic, non-slip stool.  Keep the floor dry. Clean up any water that spills on the floor as soon as it happens.  Remove soap buildup in the tub or shower regularly.  Attach bath mats securely with double-sided non-slip rug tape.  Do not have throw rugs and other things on the floor that can make you trip. What can I do in the bedroom?  Use night lights.  Make sure that you have a light by your bed that is easy to reach.  Do not use any sheets or blankets that are too big for your bed. They should not hang down onto the floor.  Have a firm chair that has side arms. You can use this for support while you get dressed.  Do not have throw rugs and other things on the floor that can make you trip. What can I do in the kitchen?  Clean up any spills right away.  Avoid walking on wet floors.  Keep items that you use a lot in easy-to-reach places.  If you need to reach something above you, use a strong step stool that has a grab bar.  Keep electrical cords out of the way.  Do not use floor polish or wax that  makes floors slippery. If you must use wax, use non-skid floor wax.  Do not have throw rugs and other things on the floor that can make you trip. What can I do with my stairs?  Do not leave any items on the stairs.  Make sure that there are handrails on both sides of the stairs and use them. Fix handrails that are broken or loose. Make sure that handrails are as long as the stairways.  Check any carpeting to make sure that it is firmly attached to the stairs. Fix any carpet that is loose or worn.  Avoid having throw rugs at the top or bottom of the stairs. If you do have throw rugs, attach them to the floor with carpet tape.  Make sure that you have a light switch at the top of the stairs and the bottom of the stairs. If you do not have them, ask someone to add them for you. What else can I do to help prevent falls?  Wear shoes that: ? Do not have high heels. ? Have rubber bottoms. ? Are comfortable and fit you well. ? Are closed at the toe. Do not wear sandals.  If you use a stepladder: ? Make sure that it is fully opened. Do not climb a closed stepladder. ? Make sure that both sides of the stepladder are locked into place. ? Ask someone to hold it for you, if possible.  Clearly mark and make sure that you can see: ? Any grab bars or handrails. ? First and last steps. ? Where the edge of each step is.  Use tools that help you move around (mobility aids) if they are needed. These include: ? Canes. ? Walkers. ? Scooters. ? Crutches.  Turn on the lights when you go into a dark area. Replace any light bulbs as soon as they burn out.  Set up your furniture so you have a clear path. Avoid moving your furniture around.  If any of your floors are uneven, fix them.  If there are any pets around you, be aware of where they are.  Review your medicines with your doctor. Some medicines can make you feel dizzy. This can increase your chance of falling. Ask your doctor what other  things that you can do to help prevent falls. This information is not intended to replace advice given to you by your health care provider. Make sure you discuss any questions you have with your health care provider. Document Released: 01/03/2009 Document Revised: 08/15/2015 Document Reviewed: 04/13/2014 Elsevier Interactive Patient Education  2018 [place] Maintenance, Female Adopting a healthy lifestyle and getting preventive care can go a long way to promote health and wellness. Talk with your health care provider about what schedule of regular examinations is right for you. This is a good chance for you to check in with your provider about disease prevention and staying healthy. In between checkups, there are plenty of things you can do on your own. Experts have done a lot of research about which lifestyle changes and preventive measures are most likely to keep you healthy. Ask your health care provider for more information. Weight and diet Eat a healthy diet  Be sure to include plenty of vegetables, fruits, low-fat dairy products, and lean protein.  Do not eat a lot of foods high in solid fats, added sugars, or salt.  Get regular exercise. This is one of the most important things you can do for your health. ? Most adults should exercise for at least 150 minutes each week. The exercise should increase your heart rate and make you sweat (moderate-intensity exercise). ? Most adults should also do strengthening exercises at least twice a week. This is in addition to the moderate-intensity exercise.  Maintain  a healthy weight  Body mass index (BMI) is a measurement that can be used to identify possible weight problems. It estimates body fat based on height and weight. Your health care provider can help determine your BMI and help you achieve or maintain a healthy weight.  For females 31 years of age and older: ? A BMI below 18.5 is considered underweight. ? A BMI of 18.5 to  24.9 is normal. ? A BMI of 25 to 29.9 is considered overweight. ? A BMI of 30 and above is considered obese.  Watch levels of cholesterol and blood lipids  You should start having your blood tested for lipids and cholesterol at 76 years of age, then have this test every 5 years.  You may need to have your cholesterol levels checked more often if: ? Your lipid or cholesterol levels are high. ? You are older than 76 years of age. ? You are at high risk for heart disease.  Cancer screening Lung Cancer  Lung cancer screening is recommended for adults 83-6 years old who are at high risk for lung cancer because of a history of smoking.  A yearly low-dose CT scan of the lungs is recommended for people who: ? Currently smoke. ? Have quit within the past 15 years. ? Have at least a 30-pack-year history of smoking. A pack year is smoking an average of one pack of cigarettes a day for 1 year.  Yearly screening should continue until it has been 15 years since you quit.  Yearly screening should stop if you develop a health problem that would prevent you from having lung cancer treatment.  Breast Cancer  Practice breast self-awareness. This means understanding how your breasts normally appear and feel.  It also means doing regular breast self-exams. Let your health care provider know about any changes, no matter how small.  If you are in your 20s or 30s, you should have a clinical breast exam (CBE) by a health care provider every 1-3 years as part of a regular health exam.  If you are 50 or older, have a CBE every year. Also consider having a breast X-ray (mammogram) every year.  If you have a family history of breast cancer, talk to your health care provider about genetic screening.  If you are at high risk for breast cancer, talk to your health care provider about having an MRI and a mammogram every year.  Breast cancer gene (BRCA) assessment is recommended for women who have family  members with BRCA-related cancers. BRCA-related cancers include: ? Breast. ? Ovarian. ? Tubal. ? Peritoneal cancers.  Results of the assessment will determine the need for genetic counseling and BRCA1 and BRCA2 testing.  Cervical Cancer Your health care provider may recommend that you be screened regularly for cancer of the pelvic organs (ovaries, uterus, and vagina). This screening involves a pelvic examination, including checking for microscopic changes to the surface of your cervix (Pap test). You may be encouraged to have this screening done every 3 years, beginning at age 23.  For women ages 37-65, health care providers may recommend pelvic exams and Pap testing every 3 years, or they may recommend the Pap and pelvic exam, combined with testing for human papilloma virus (HPV), every 5 years. Some types of HPV increase your risk of cervical cancer. Testing for HPV may also be done on women of any age with unclear Pap test results.  Other health care providers may not recommend any screening for nonpregnant women who  are considered low risk for pelvic cancer and who do not have symptoms. Ask your health care provider if a screening pelvic exam is right for you.  If you have had past treatment for cervical cancer or a condition that could lead to cancer, you need Pap tests and screening for cancer for at least 20 years after your treatment. If Pap tests have been discontinued, your risk factors (such as having a new sexual partner) need to be reassessed to determine if screening should resume. Some women have medical problems that increase the chance of getting cervical cancer. In these cases, your health care provider may recommend more frequent screening and Pap tests.  Colorectal Cancer  This type of cancer can be detected and often prevented.  Routine colorectal cancer screening usually begins at 76 years of age and continues through 76 years of age.  Your health care provider may  recommend screening at an earlier age if you have risk factors for colon cancer.  Your health care provider may also recommend using home test kits to check for hidden blood in the stool.  A small camera at the end of a tube can be used to examine your colon directly (sigmoidoscopy or colonoscopy). This is done to check for the earliest forms of colorectal cancer.  Routine screening usually begins at age 61.  Direct examination of the colon should be repeated every 5-10 years through 76 years of age. However, you may need to be screened more often if early forms of precancerous polyps or small growths are found.  Skin Cancer  Check your skin from head to toe regularly.  Tell your health care provider about any new moles or changes in moles, especially if there is a change in a mole's shape or color.  Also tell your health care provider if you have a mole that is larger than the size of a pencil eraser.  Always use sunscreen. Apply sunscreen liberally and repeatedly throughout the day.  Protect yourself by wearing long sleeves, pants, a wide-brimmed hat, and sunglasses whenever you are outside.  Heart disease, diabetes, and high blood pressure  High blood pressure causes heart disease and increases the risk of stroke. High blood pressure is more likely to develop in: ? People who have blood pressure in the high end of the normal range (130-139/85-89 mm Hg). ? People who are overweight or obese. ? People who are African American.  If you are 46-6 years of age, have your blood pressure checked every 3-5 years. If you are 36 years of age or older, have your blood pressure checked every year. You should have your blood pressure measured twice-once when you are at a hospital or clinic, and once when you are not at a hospital or clinic. Record the average of the two measurements. To check your blood pressure when you are not at a hospital or clinic, you can use: ? An automated blood pressure  machine at a pharmacy. ? A home blood pressure monitor.  If you are between 62 years and 58 years old, ask your health care provider if you should take aspirin to prevent strokes.  Have regular diabetes screenings. This involves taking a blood sample to check your fasting blood sugar level. ? If you are at a normal weight and have a low risk for diabetes, have this test once every three years after 76 years of age. ? If you are overweight and have a high risk for diabetes, consider being tested at  a younger age or more often. Preventing infection Hepatitis B  If you have a higher risk for hepatitis B, you should be screened for this virus. You are considered at high risk for hepatitis B if: ? You were born in a country where hepatitis B is common. Ask your health care provider which countries are considered high risk. ? Your parents were born in a high-risk country, and you have not been immunized against hepatitis B (hepatitis B vaccine). ? You have HIV or AIDS. ? You use needles to inject street drugs. ? You live with someone who has hepatitis B. ? You have had sex with someone who has hepatitis B. ? You get hemodialysis treatment. ? You take certain medicines for conditions, including cancer, organ transplantation, and autoimmune conditions.  Hepatitis C  Blood testing is recommended for: ? Everyone born from [birth year] through 1965. ? Anyone with known risk factors for hepatitis C.  Sexually transmitted infections (STIs)  You should be screened for sexually transmitted infections (STIs) including gonorrhea and chlamydia if: ? You are sexually active and are younger than 76 years of age. ? You are older than 76 years of age and your health care provider tells you that you are at risk for this type of infection. ? Your sexual activity has changed since you were last screened and you are at an increased risk for chlamydia or gonorrhea. Ask your health care provider if you are at  risk.  If you do not have HIV, but are at risk, it may be recommended that you take a prescription medicine daily to prevent HIV infection. This is called pre-exposure prophylaxis (PrEP). You are considered at risk if: ? You are sexually active and do not regularly use condoms or know the HIV status of your partner(s). ? You take drugs by injection. ? You are sexually active with a partner who has HIV.  Talk with your health care provider about whether you are at high risk of being infected with HIV. If you choose to begin PrEP, you should first be tested for HIV. You should then be tested every 3 months for as long as you are taking PrEP. Pregnancy  If you are premenopausal and you may become pregnant, ask your health care provider about preconception counseling.  If you may become pregnant, take 400 to 800 micrograms (mcg) of folic acid every day.  If you want to prevent pregnancy, talk to your health care provider about birth control (contraception). Osteoporosis and menopause  Osteoporosis is a disease in which the bones lose minerals and strength with aging. This can result in serious bone fractures. Your risk for osteoporosis can be identified using a bone density scan.  If you are 10 years of age or older, or if you are at risk for osteoporosis and fractures, ask your health care provider if you should be screened.  Ask your health care provider whether you should take a calcium or vitamin D supplement to lower your risk for osteoporosis.  Menopause may have certain physical symptoms and risks.  Hormone replacement therapy may reduce some of these symptoms and risks. Talk to your health care provider about whether hormone replacement therapy is right for you. Follow these instructions at home:  Schedule regular health, dental, and eye exams.  Stay current with your immunizations.  Do not use any tobacco products including cigarettes, chewing tobacco, or electronic  cigarettes.  If you are pregnant, do not drink alcohol.  If you are breastfeeding, limit  how much and how often you drink alcohol.  Limit alcohol intake to no more than 1 drink per day for nonpregnant women. One drink equals 12 ounces of beer, 5 ounces of wine, or 1 ounces of hard liquor.  Do not use street drugs.  Do not share needles.  Ask your health care provider for help if you need support or information about quitting drugs.  Tell your health care provider if you often feel depressed.  Tell your health care provider if you have ever been abused or do not feel safe at home. This information is not intended to replace advice given to you by your health care provider. Make sure you discuss any questions you have with your health care provider. Document Released: 09/22/2010 Document Revised: 08/15/2015 Document Reviewed: 12/11/2014 Elsevier Interactive Patient Education  [employer].

## 2016-12-17 NOTE — Patient Instructions (Addendum)
BEFORE YOU LEAVE: -obtain and abstract eye exam and last colonoscopy ([facility]) -labs -flu shot -AWV with Manuela Schwartz later today -follow up: with Dr. Maudie Mercury in 3-4 months  -We placed a referral for you as discussed to [facility] regarding colon cancer screening. It usually takes about 1-2 weeks to process and schedule this referral. If you have not heard from [place] regarding this appointment in 2 weeks please contact our office.  -Vit D3 [phone] IU daily  -STOP the Pepcid - let us know if acid reflux returns  -Advise regular aerobic exercise (at least 150 minutes per week of sweaty exercise) and a healthy diet. Try to eat at least 5-9 servings of vegetables and fruits per day (not corn, potatoes or bananas.) Avoid sweets, red meat, pork, butter, fried foods, fast food, processed food, excessive dairy, eggs and coconut. Replace bad fats with good fats - fish, nuts and seeds, canola oil, olive oil.

## 2016-12-17 NOTE — Telephone Encounter (Signed)
error 

## 2016-12-21 NOTE — Progress Notes (Signed)
KIM, HANNAH R., DO  

## 2016-12-24 NOTE — Addendum Note (Signed)
Addended by: Agnes Lawrence on: 12/24/2016 04:33 PM   Modules accepted: Orders

## 2016-12-30 ENCOUNTER — Telehealth: Payer: Self-pay | Admitting: Gastroenterology

## 2016-12-30 ENCOUNTER — Other Ambulatory Visit (INDEPENDENT_AMBULATORY_CARE_PROVIDER_SITE_OTHER): Payer: Medicare HMO

## 2016-12-30 DIAGNOSIS — D649 Anemia, unspecified: Secondary | ICD-10-CM | POA: Diagnosis not present

## 2016-12-30 LAB — VITAMIN B12: VITAMIN B 12: 446 pg/mL (ref 211–911)

## 2016-12-31 ENCOUNTER — Encounter: Payer: Self-pay | Admitting: Gastroenterology

## 2016-12-31 NOTE — Telephone Encounter (Signed)
I reviewed the records. Colonoscopy 01/2008. The polyp was hyperplastic, which is not precancerous.  No reported family history of colon cancer.  However, the colonoscopy report indicates that the preparation (clean-out) was "fair".   So, screening guidelines state typically this would be repeat colonoscopy at t 10 year interval.  However, with less than optimal prep on last exam, my advice is not to wait the full 10 years.  It has been 9 years now, so I will offer a colonoscopy any time now.  Please set up [facility] nurse visit.  Dx: history colon polyps

## 2016-12-31 NOTE — Telephone Encounter (Signed)
Patient is scheduled for colonoscopy 03/04/17 and pre-visit 02/19/17.

## 2017-01-05 ENCOUNTER — Encounter: Payer: Self-pay | Admitting: Family Medicine

## 2017-02-18 ENCOUNTER — Ambulatory Visit (AMBULATORY_SURGERY_CENTER): Payer: Self-pay

## 2017-02-18 ENCOUNTER — Encounter: Payer: Self-pay | Admitting: Gastroenterology

## 2017-02-18 VITALS — Ht 62.0 in | Wt 156.2 lb

## 2017-02-18 DIAGNOSIS — Z860100 Personal history of colon polyps, unspecified: Secondary | ICD-10-CM

## 2017-02-18 MED ORDER — SUPREP BOWEL PREP KIT 17.5-3.13-1.6 GM/177ML PO SOLN: 1.0000 | Freq: Once | ORAL | 0 refills | Status: AC

## 2017-02-18 NOTE — Progress Notes (Signed)
No allergies to eggs or soy No diet meds No home oxygen No past problems with anesthesia  Declined emmi 

## 2017-03-04 ENCOUNTER — Ambulatory Visit (AMBULATORY_SURGERY_CENTER): Payer: Medicare HMO | Admitting: Gastroenterology

## 2017-03-04 ENCOUNTER — Encounter: Payer: Self-pay | Admitting: Gastroenterology

## 2017-03-04 VITALS — BP 129/67 | HR 59 | Temp 96.4°F | Resp 15 | Ht 62.0 in | Wt 156.0 lb

## 2017-03-04 DIAGNOSIS — D123 Benign neoplasm of transverse colon: Secondary | ICD-10-CM

## 2017-03-04 DIAGNOSIS — D122 Benign neoplasm of ascending colon: Secondary | ICD-10-CM | POA: Diagnosis not present

## 2017-03-04 DIAGNOSIS — Z86010 Personal history of colonic polyps: Secondary | ICD-10-CM | POA: Diagnosis present

## 2017-03-04 DIAGNOSIS — Z1211 Encounter for screening for malignant neoplasm of colon: Secondary | ICD-10-CM | POA: Diagnosis not present

## 2017-03-04 DIAGNOSIS — I10 Essential (primary) hypertension: Secondary | ICD-10-CM | POA: Diagnosis not present

## 2017-03-04 MED ORDER — SODIUM CHLORIDE 0.9 % IV SOLN: 500.0000 mL | INTRAVENOUS | Status: DC

## 2017-03-04 NOTE — Patient Instructions (Signed)
YOU HAD AN ENDOSCOPIC PROCEDURE TODAY AT THE [facility]:   Refer to the procedure report that was given to you for any specific questions about what was found during the examination.  If the procedure report does not answer your questions, please call your gastroenterologist to clarify.  If you requested that your care partner not be given the details of your procedure findings, then the procedure report has been included in a sealed envelope for you to review at your convenience later.  YOU SHOULD EXPECT: Some feelings of bloating in the abdomen. Passage of more gas than usual.  Walking can help get rid of the air that was put into your GI tract during the procedure and reduce the bloating. If you had a lower endoscopy (such as a colonoscopy or flexible sigmoidoscopy) you may notice spotting of blood in your stool or on the toilet paper. If you underwent a bowel prep for your procedure, you may not have a normal bowel movement for a few days.  Please Note:  You might notice some irritation and congestion in your nose or some drainage.  This is from the oxygen used during your procedure.  There is no need for concern and it should clear up in a day or so.  SYMPTOMS TO REPORT IMMEDIATELY:   Following lower endoscopy (colonoscopy or flexible sigmoidoscopy):  Excessive amounts of blood in the stool  Significant tenderness or worsening of abdominal pains  Swelling of the abdomen that is new, acute  Fever of 100F or higher    For urgent or emergent issues, a gastroenterologist can be reached at any hour by calling [phone].   DIET:  We do recommend a small meal at first, but then you may proceed to your regular diet.  Drink plenty of fluids but you should avoid alcoholic beverages for 24 hours.  ACTIVITY:  You should plan to take it easy for the rest of today and you should NOT DRIVE or use heavy machinery until tomorrow (because of the sedation medicines used during the test).     FOLLOW UP: Our staff will call the number listed on your records the next business day following your procedure to check on you and address any questions or concerns that you may have regarding the information given to you following your procedure. If we do not reach you, we will leave a message.  However, if you are feeling well and you are not experiencing any problems, there is no need to return our call.  We will assume that you have returned to your regular daily activities without incident.  If any biopsies were taken you will be contacted by phone or by letter within the next 1-3 weeks.  Please call us at [phone] if you have not heard about the biopsies in 3 weeks.    SIGNATURES/CONFIDENTIALITY: You and/or your care partner have signed paperwork which will be entered into your electronic medical record.  These signatures attest to the fact that that the information above on your After Visit Summary has been reviewed and is understood.  Full responsibility of the confidentiality of this discharge information lies with you and/or your care-partner.   Resume medications. Information given on polyps and diverticulosis. 

## 2017-03-04 NOTE — Progress Notes (Signed)
Called to room to assist during endoscopic procedure.  Patient ID and intended procedure confirmed with present staff. Received instructions for my participation in the procedure from the performing physician.  

## 2017-03-04 NOTE — Progress Notes (Signed)
Pt's states no medical or surgical changes since previsit or office visit. 

## 2017-03-04 NOTE — Progress Notes (Signed)
Report given to PACU, vss 

## 2017-03-04 NOTE — Op Note (Signed)
[place] Patient Name: Valerie Nguyen Procedure Date: 03/04/2017 10:34 AM MRN: [id number] Endoscopist: Pulpotio Bareas. Loletha Carrow , MD Age: 76 Referring MD:  Date of Birth: 02/20/1941 Gender: Female Account #: [id number] Procedure:                Colonoscopy Indications:              Personal history of colonic polyps (hyperplastic                            polyp in 01/2008, however with reportedly "fair"                            bowel preparation) Medicines:                Monitored Anesthesia Care Procedure:                Pre-Anesthesia Assessment:                           - Prior to the procedure, a History and Physical                            was performed, and patient medications and                            allergies were reviewed. The patient's tolerance of                            previous anesthesia was also reviewed. The risks                            and benefits of the procedure and the sedation                            options and risks were discussed with the patient.                            All questions were answered, and informed consent                            was obtained. Prior Anticoagulants: The patient has                            taken no previous anticoagulant or antiplatelet                            agents. ASA Grade Assessment: II - A patient with                            mild systemic disease. After reviewing the risks                            and benefits, the patient was deemed in  satisfactory condition to undergo the procedure.                           After obtaining informed consent, the colonoscope                            was passed under direct vision. Throughout the                            procedure, the patient's blood pressure, pulse, and                            oxygen saturations were monitored continuously. The                            Model PCF-[id number]) scope was  introduced                            through the anus and advanced to the the cecum,                            identified by appendiceal orifice and ileocecal                            valve. The colonoscopy was performed without                            difficulty. The patient tolerated the procedure                            well. The quality of the bowel preparation was                            excellent. The ileocecal valve, appendiceal                            orifice, and rectum were photographed. The quality                            of the bowel preparation was evaluated using the                            BBPS [facility] Bowel Preparation Scale) with scores                            of: Right Colon = 3, Transverse Colon = 3 and Left                            Colon = 3 (entire mucosa seen well with no residual                            staining, small fragments of stool or opaque  liquid). The total BBPS score equals 9. The bowel                            preparation used was SUPREP. Scope In: 10:44:29 AM Scope Out: 11:00:04 AM Scope Withdrawal Time: 0 hours 10 minutes 46 seconds  Total Procedure Duration: 0 hours 15 minutes 35 seconds  Findings:                 The perianal and digital rectal examinations were                            normal.                           Three sessile polyps were found in the transverse                            colon and ascending colon. The polyps were 2 to 4                            mm in size. These polyps were removed with a cold                            biopsy forceps. Resection and retrieval were                            complete.                           Diverticula were found in the left colon.                           The colon (entire examined portion) was moderately                            redundant.                           The entire examined colon appeared normal on direct                             and retroflexion views. Complications:            No immediate complications. Estimated Blood Loss:     Estimated blood loss was minimal. Impression:               - Three 2 to 4 mm polyps in the transverse colon                            and in the ascending colon, removed with a cold                            biopsy forceps. Resected and retrieved.                           - Diverticulosis in the left  colon.                           - Redundant colon.                           - The entire examined colon is normal on direct and                            retroflexion views. Recommendation:           - Patient has a contact number available for                            emergencies. The signs and symptoms of potential                            delayed complications were discussed with the                            patient. Return to normal activities tomorrow.                            Written discharge instructions were provided to the                            patient.                           - Resume previous diet.                           - Continue present medications.                           - Await pathology results.                           - No repeat surveillabnce colonoscopy due to age                            (in keeping with current guidelines). Luisenrique Conran L. Loletha Carrow, MD 03/04/2017 11:05:17 AM This report has been signed electronically.

## 2017-03-05 ENCOUNTER — Telehealth: Payer: Self-pay | Admitting: *Deleted

## 2017-03-05 NOTE — Telephone Encounter (Signed)
  Follow up Call-  Call back number 03/04/2017  Post procedure Call Back phone  # [phone] cell  Permission to leave phone message No  Some recent data might be hidden     Patient questions:  Do you have a fever, pain , or abdominal swelling? No. Pain Score  0 *  Have you tolerated food without any problems? Yes.    Have you been able to return to your normal activities? Yes.    Do you have any questions about your discharge instructions: Diet   No. Medications  No. Follow up visit  No.  Do you have questions or concerns about your Care? No.  Actions: * If pain score is 4 or above: No action needed, pain <4.

## 2017-03-14 ENCOUNTER — Encounter: Payer: Self-pay | Admitting: Gastroenterology

## 2017-04-01 ENCOUNTER — Encounter: Payer: Self-pay | Admitting: Family Medicine

## 2017-04-19 NOTE — Progress Notes (Deleted)
HPI:  Valerie Nguyen is a pleasant 77 y.o. here for follow up. Chronic medical problems summarized below were reviewed for changes and stability and were updated as needed below. These issues and their treatment remain stable for the most part. ***. Denies CP, SOB, DOE, treatment intolerance or new symptoms. Due for labs AWV was 11/2016  Diet controlled DM: -w/ mild CKD -meds: asa -lifestlye: diet so so  HTN with diabetes: -meds: lisinopril-hctz, metoprolol tartrate -hx labile elevated HR and ups and downs in BP  HLD: -meds: simvastatin -stable  GERD: -meds: pepcid -stable fo a long time  Varicose veins/Thrombophlebitis: -seeing Dr. Sherren Mocha Early   ROS: See pertinent positives and negatives per HPI.  Past Medical History:  Diagnosis Date  . Borderline diabetes 06/22/2012  . Chronic constipation   . CKD (chronic kidney disease) 03/24/2012  . GERD (gastroesophageal reflux disease) 03/24/2012  . High cholesterol   . Hypertension   . Lower extremity edema 03/24/2012  . Varicose veins     Past Surgical History:  Procedure Laterality Date  . ABDOMINAL HYSTERECTOMY  1987  . BREAST EXCISIONAL BIOPSY Left   . COLONOSCOPY    . ENDOVENOUS ABLATION SAPHENOUS VEIN W/ LASER Right 08-22-2015   endovenous laser ablation right greater saphenous vein by Curt Jews MD   . extrraction of 1 wisdom tooth    . laser varicose veins    . LASIK      Family History  Problem Relation Age of Onset  . Breast cancer Mother   . Hypertension Mother   . Alzheimer's disease Mother   . Hypertension Father   . Bone cancer Brother   . Prostate cancer Brother   . Colon cancer Neg Hx   . Esophageal cancer Neg Hx   . Pancreatic cancer Neg Hx   . Rectal cancer Neg Hx   . Stomach cancer Neg Hx     Social History   Socioeconomic History  . Marital status: Married    Spouse name: Not on file  . Number of children: Not on file  . Years of education: Not on file  . Highest education level: Not  on file  Social Needs  . Financial resource strain: Not on file  . Food insecurity - worry: Not on file  . Food insecurity - inability: Not on file  . Transportation needs - medical: Not on file  . Transportation needs - non-medical: Not on file  Occupational History  . Not on file  Tobacco Use  . Smoking status: Never Smoker  . Smokeless tobacco: Never Used  Substance and Sexual Activity  . Alcohol use: Yes    Alcohol/week: 0.0 oz    Comment: maybe a half a glass at Christmas  . Drug use: No  . Sexual activity: Not on file  Other Topics Concern  . Not on file  Social History Narrative  . Not on file     Current Outpatient Medications:  .  aspirin 81 MG tablet, Take 81 mg by mouth daily., Disp: , Rfl:  .  cholecalciferol (VITAMIN D) 1000 units tablet, Take 1,000 Units by mouth daily., Disp: , Rfl:  .  lisinopril-hydrochlorothiazide (PRINZIDE,ZESTORETIC) 10-12.5 MG tablet, Take 1 tablet by mouth daily., Disp: 90 tablet, Rfl: 3 .  metoprolol tartrate (LOPRESSOR) 25 MG tablet, TAKE 1 TABLET (25 MG TOTAL) BY MOUTH DAILY., Disp: 90 tablet, Rfl: 3 .  OVER THE COUNTER MEDICATION, , Disp: , Rfl:  .  simvastatin (ZOCOR) 40 MG tablet, Take 1 tablet (  40 mg total) by mouth at bedtime., Disp: 90 tablet, Rfl: 3  EXAM:  There were no vitals filed for this visit.  There is no height or weight on file to calculate BMI.  GENERAL: vitals reviewed and listed above, alert, oriented, appears well hydrated and in no acute distress  HEENT: atraumatic, conjunttiva clear, no obvious abnormalities on inspection of external nose and ears  NECK: no obvious masses on inspection  LUNGS: clear to auscultation bilaterally, no wheezes, rales or rhonchi, good air movement  CV: HRRR, no peripheral edema  MS: moves all extremities without noticeable abnormality *** PSYCH: pleasant and cooperative, no obvious depression or anxiety  ASSESSMENT AND PLAN:  Discussed the following assessment and  plan:  No diagnosis found.  *** -Patient advised to return or notify a doctor immediately if symptoms worsen or persist or new concerns arise.  There are no Patient Instructions on file for this visit.  Lucretia Kern, DO

## 2017-04-22 ENCOUNTER — Ambulatory Visit: Payer: Medicare HMO | Admitting: Family Medicine

## 2017-08-10 ENCOUNTER — Other Ambulatory Visit: Payer: Self-pay | Admitting: Family Medicine

## 2017-08-10 DIAGNOSIS — Z1231 Encounter for screening mammogram for malignant neoplasm of breast: Secondary | ICD-10-CM

## 2017-09-07 ENCOUNTER — Ambulatory Visit
Admission: RE | Admit: 2017-09-07 | Discharge: 2017-09-07 | Disposition: A | Discharge status: Home / Self Care | Payer: Medicare HMO | Source: Ambulatory Visit | Attending: Family Medicine | Admitting: Family Medicine

## 2017-09-07 DIAGNOSIS — Z1231 Encounter for screening mammogram for malignant neoplasm of breast: Secondary | ICD-10-CM

## 2017-09-20 ENCOUNTER — Other Ambulatory Visit: Payer: Self-pay | Admitting: Family Medicine

## 2017-10-13 IMAGING — US US EXTREM LOW VENOUS*R*
1 series · 13 of 24 positions shown · non-contrast
Comparison: None.

CLINICAL DATA: Right lower extremity swelling and redness for 3
days. Edema, color changes, varicose veins.



[Series 1: us extrem low venous*right* · 0.08mm/px · 30 acquisitions, 13 frames shown]
[im 1/30]
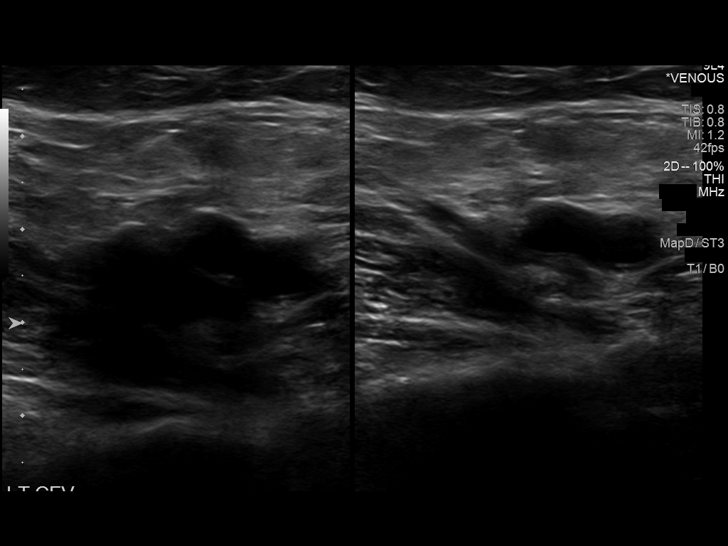
[im 3/30]
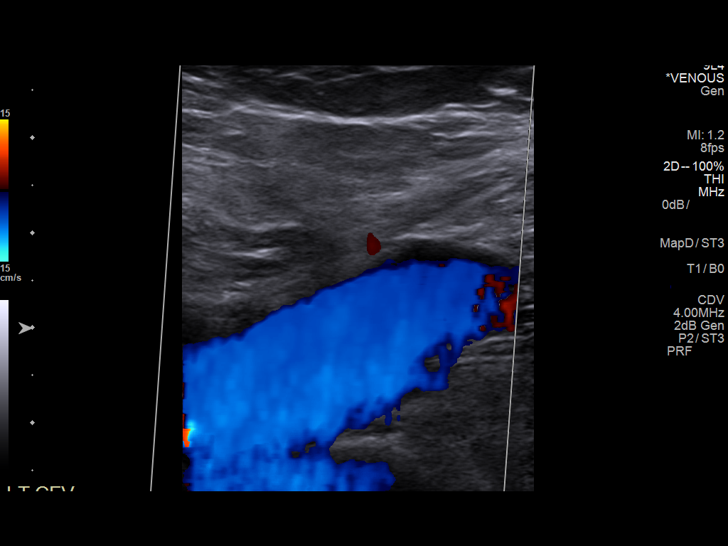
[im 6/30]
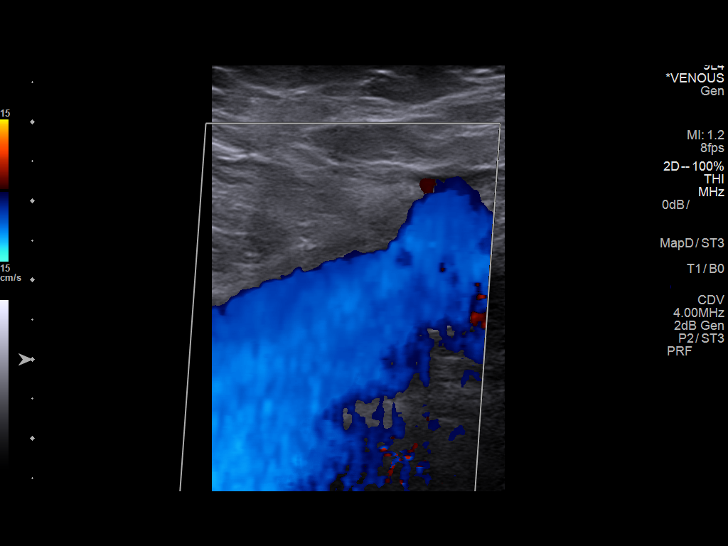
[im 8/30]
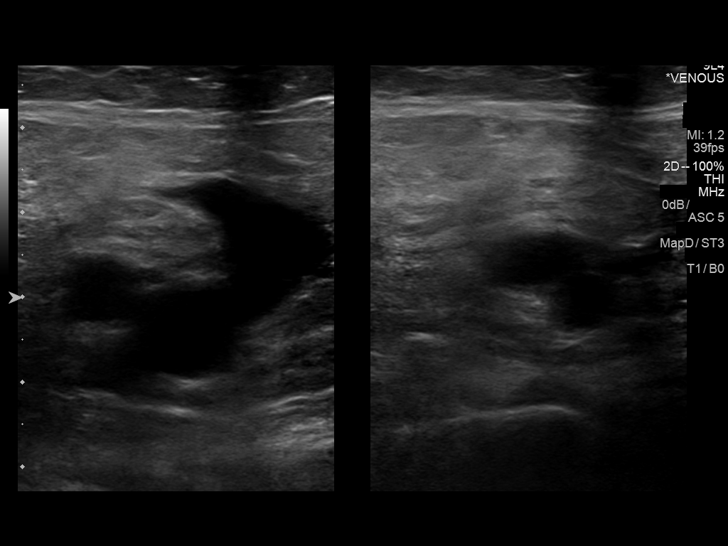
[im 11/30]
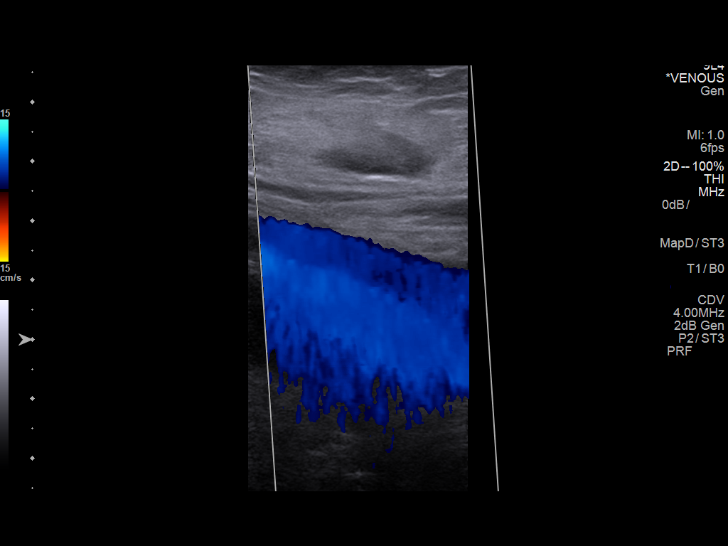
[im 13/30]
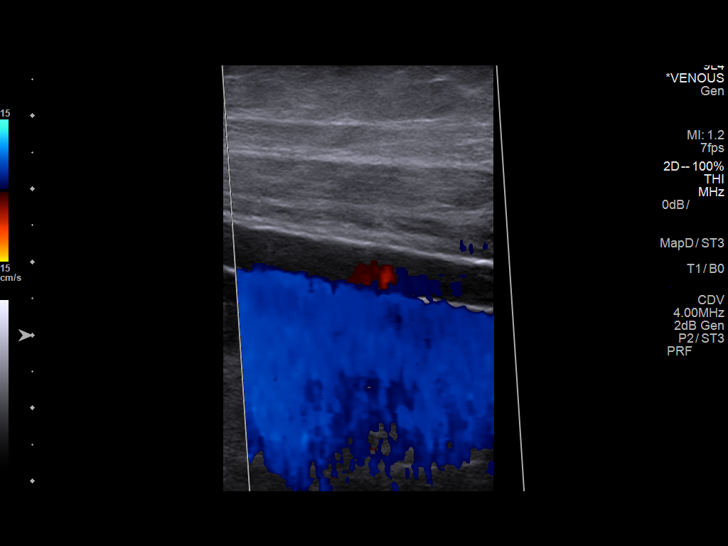
[im 17/30]
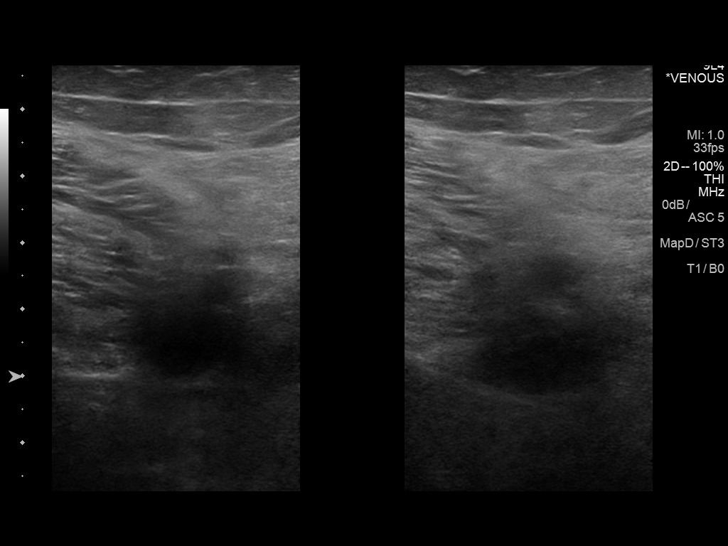
[im 18/30]
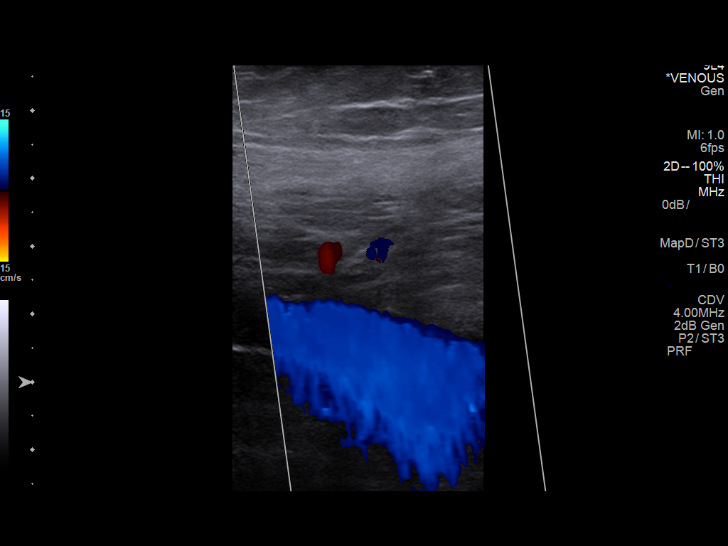
[im 21/30]
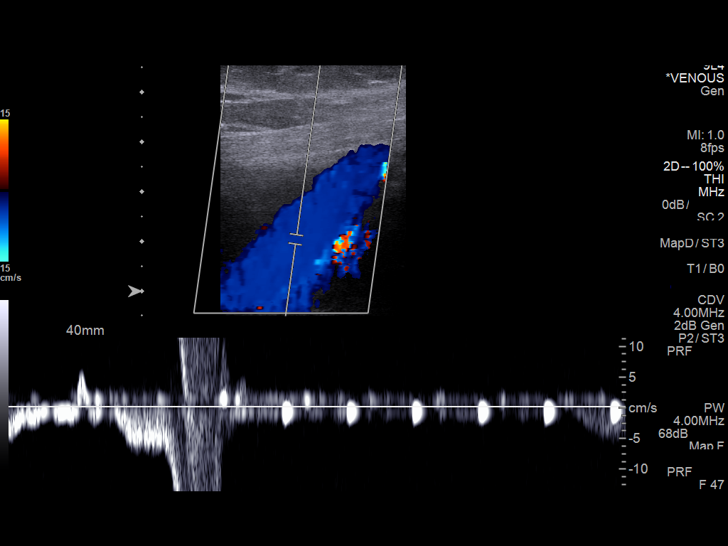
[im 23/30]
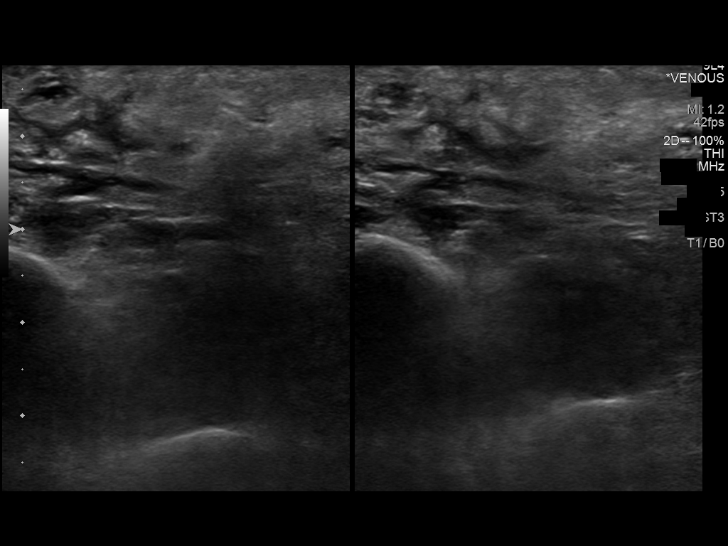
[im 24/30]
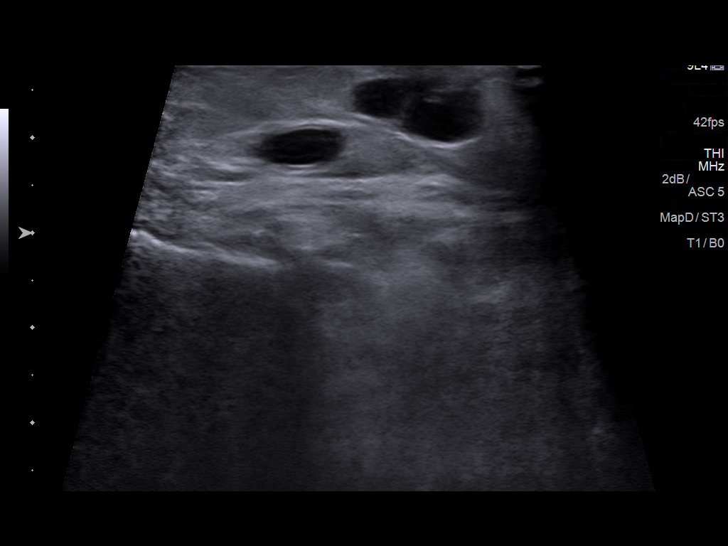
[im 27/30]
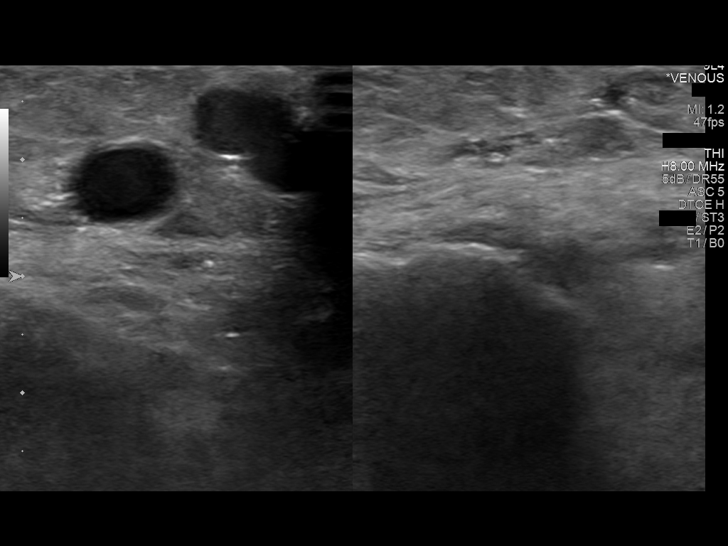
[im 30/30]
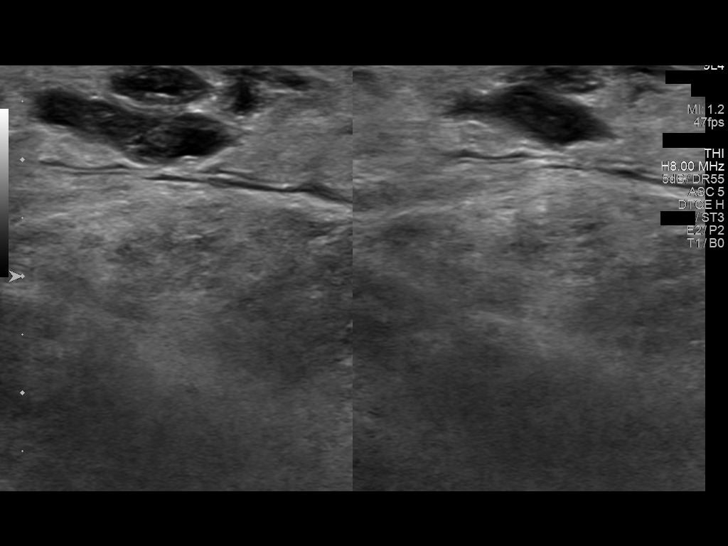

[13 of 24 positions shown; findings below may reference images not displayed]

FINDINGS: Contralateral Common Femoral Vein: Respiratory phasicity is normal
and symmetric with the symptomatic side. No evidence of thrombus.
Normal compressibility.

Common Femoral Vein: No evidence of thrombus. Normal
compressibility, respiratory phasicity and response to augmentation.

Saphenofemoral Junction: No evidence of thrombus. Normal
compressibility and flow on color Doppler imaging.

Profunda Femoral Vein: No evidence of thrombus. Normal
compressibility and flow on color Doppler imaging.

Femoral Vein: No evidence of thrombus. Normal compressibility,
respiratory phasicity and response to augmentation.

Popliteal Vein: No evidence of thrombus. Normal compressibility,
respiratory phasicity and response to augmentation.

Calf Veins: No evidence of thrombus. Normal compressibility and flow
on color Doppler imaging.

Superficial Great Saphenous Vein: No evidence of thrombus. Normal
compressibility and flow on color Doppler imaging.

Venous Reflux:  None.

Other Findings: Superficial venous varicosities are noted in the
medial aspect of the right calf, some with evidence of thrombus.
Incidental note of a prominent lymph node in the right groin which
has a fatty center consistent with benign lymph node.
IMPRESSION: No evidence of deep venous thrombosis. Thrombosed superficial venous
varicosities are noted in the medial aspect of the right calf.

## 2017-12-02 DIAGNOSIS — Z961 Presence of intraocular lens: Secondary | ICD-10-CM | POA: Diagnosis not present

## 2017-12-02 DIAGNOSIS — H5211 Myopia, right eye: Secondary | ICD-10-CM | POA: Diagnosis not present

## 2017-12-02 DIAGNOSIS — R7303 Prediabetes: Secondary | ICD-10-CM | POA: Diagnosis not present

## 2017-12-20 NOTE — Progress Notes (Signed)
Subjective:   Valerie Nguyen is a 77 y.o. female who presents for Medicare Annual (Subsequent) preventive examination.  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 Goes to [employer] to get the broccoli soup Gets fresh eggs   BMI 28.4  Exercise-  Works 12 hours and is on feet all day Has a treadmill; walks 30 minutes a day 3 to 4 times a week  [place] on occasion    ETOH rare Tobacco Never smoked    Health Maintenance Due  Topic Date Due  . HEMOGLOBIN A1C  06/16/2017  . FOOT EXAM  08/13/2017  . INFLUENZA VACCINE  10/21/2017  . OPHTHALMOLOGY EXAM  11/26/2017   Mammogram 08/2017   Dexa 12/2009; No report - [facility] to fax over report for Dr. Elise Benne it was normal   Colonoscopy 02/2017 3 polyps.  Thinks she is done  Ophthalmology Dr. Gershon Crane sent report in media  Due around Sept 12 2019  Checked for diabetic retinopathy   Will take your flu vaccine today    Cardiac Risk Factors include: advanced age (>54men, >63 women);hypertension;family history of premature cardiovascular disease;diabetes mellitus;dyslipidemia     Objective:     Vitals: BP 118/70   Pulse 67   Ht 5' 2.5" (1.588 m)   Wt 150 lb 6 oz (68.2 kg)   SpO2 98%   BMI 27.07 kg/m   Body mass index is 27.07 kg/m.  Advanced  Directives 12/21/2017 08/22/2015 04/25/2015 03/05/2015 11/08/2013  Does Patient Have a Medical Advance Directive? Yes No No No Yes;No  Does patient want to make changes to medical advance directive? - - - - Yes - information given  Would patient like information on creating a medical advance directive? - - - No - patient declined information -    Tobacco Social History   Tobacco Use  Smoking Status Never Smoker  Smokeless Tobacco Never Used     Counseling given: Yes   Clinical Intake:  Past Medical History:  Diagnosis Date  . Borderline diabetes 06/22/2012  . Chronic constipation   . CKD (chronic kidney disease) 03/24/2012  . GERD (gastroesophageal reflux disease) 03/24/2012  . High cholesterol   . Hypertension   . Lower extremity edema 03/24/2012  . Varicose veins    Past Surgical History:  Procedure Laterality Date  . ABDOMINAL HYSTERECTOMY  1987  . BREAST EXCISIONAL BIOPSY Left   . COLONOSCOPY    . ENDOVENOUS ABLATION SAPHENOUS VEIN W/ LASER Right 08-22-2015   endovenous laser ablation right greater saphenous vein by Curt Jews MD   . extrraction of 1 wisdom tooth    . laser varicose veins    . LASIK  Family History  Problem Relation Age of Onset  . Breast cancer Mother   . Hypertension Mother   . Alzheimer's disease Mother   . Hypertension Father   . Bone cancer Brother   . Prostate cancer Brother   . Colon cancer Neg Hx   . Esophageal cancer Neg Hx   . Pancreatic cancer Neg Hx   . Rectal cancer Neg Hx   . Stomach cancer Neg Hx    Social History   Socioeconomic History  . Marital status: Married    Spouse name: Not on file  . Number of children: Not on file  . Years of education: Not on file  . Highest education level: Not on file  Occupational History  . Not on file  Social Needs  . Financial resource strain: Not on file  . Food insecurity:    Worry: Not on file    Inability: Not on file  . Transportation needs:    Medical: Not on file    Non-medical:  Not on file  Tobacco Use  . Smoking status: Never Smoker  . Smokeless tobacco: Never Used  Substance and Sexual Activity  . Alcohol use: Yes    Alcohol/week: 0.0 standard drinks    Comment: maybe a half a glass at Christmas  . Drug use: No  . Sexual activity: Not on file  Lifestyle  . Physical activity:    Days per week: Not on file    Minutes per session: Not on file  . Stress: Not on file  Relationships  . Social connections:    Talks on phone: Not on file    Gets together: Not on file    Attends religious service: Not on file    Active member of club or organization: Not on file    Attends meetings of clubs or organizations: Not on file    Relationship status: Not on file  Other Topics Concern  . Not on file  Social History Narrative  . Not on file    Outpatient Encounter Medications as of 12/21/2017  Medication Sig  . aspirin 81 MG tablet Take 81 mg by mouth daily.  . cholecalciferol (VITAMIN D) 1000 units tablet Take 1,000 Units by mouth daily.  [name] lisinopril-hydrochlorothiazide (PRINZIDE,ZESTORETIC) 10-12.5 MG tablet TAKE 1 TABLET BY MOUTH DAILY  . metoprolol tartrate (LOPRESSOR) 25 MG tablet TAKE 1 TABLET BY MOUTH DAILY  . OVER THE COUNTER MEDICATION   . simvastatin (ZOCOR) 40 MG tablet TAKE 1 TABLET BY MOUTH AT BEDTIME   No facility-administered encounter medications on file as of 12/21/2017.     Activities of Daily Living In your present state of health, do you have any difficulty performing the following activities: 12/21/2017  Hearing? N  Vision? N  Difficulty concentrating or making decisions? N  Comment just got back from [place]  Walking or climbing stairs? N  Dressing or bathing? N  Doing errands, shopping? N  Preparing Food and eating ? N  Using the Toilet? N  In the past six months, have you accidently leaked urine? N  Do you have problems with loss of bowel control? N  Managing your Medications? N  Managing your Finances? N  Housekeeping or  managing your Housekeeping? N  Some recent data might be hidden    Patient Care Team: Lucretia Kern, DO as PCP - General (Family Medicine) Deboraha Sprang, MD as Consulting Physician (Cardiology)    Assessment:   This is a routine wellness examination for Bevin.  Exercise Activities and Dietary recommendations Current Exercise Habits: Home exercise routine, Type of exercise: walking, Time (Minutes): 30(sometimes 2 times x 30 minutes), Frequency (Times/Week): 5, Weekly Exercise (Minutes/Week): 150, Intensity: Moderate  Goals    . patient     Maintaining her health Taking care of grand children     . Patient Stated     May try to find group to socialize with        Fall Risk Fall Risk  12/21/2017 12/17/2016 08/13/2016 12/13/2014 11/08/2013  Falls in the past year? No No No No No     Depression Screen PHQ 2/9 Scores 12/21/2017 12/17/2016 12/17/2016 08/13/2016  PHQ - 2 Score 0 0 0 0     Cognitive Function MMSE - Mini Mental State Exam 12/21/2017 12/17/2016  Not completed: (No Data) (No Data)   Ad8 score reviewed for issues:  Issues making decisions:  Less interest in hobbies / activities:  Repeats questions, stories (family complaining):  Trouble using ordinary gadgets (microwave, computer, phone):  Forgets the month or year:   Mismanaging finances:   Remembering appts:  Daily problems with thinking and/or memory: Ad8 score is=0          Immunization History  Administered Date(s) Administered  . Influenza, High Dose Seasonal PF 12/17/2016  . Pneumococcal Polysaccharide-23 07/08/2011  . Tdap 07/08/2011     Screening Tests Health Maintenance  Topic Date Due  . HEMOGLOBIN A1C  06/16/2017  . FOOT EXAM  08/13/2017  . INFLUENZA VACCINE  10/21/2017  . OPHTHALMOLOGY EXAM  11/26/2017  . PNA vac Low Risk Adult (2 of 2 - PCV13) 06/12/2024 (Originally 07/07/2012)  . MAMMOGRAM  09/08/2018  . TETANUS/TDAP  07/07/2021  . DEXA SCAN  Completed        Plan:        PCP Notes   Health Maintenance Mammogram 08/2017   Dexa 12/2009; No report - [facility] to fax over report for Dr. Elise Benne it was normal   Colonoscopy 02/2017 3 polyps.  Thinks she is done  Ophthalmology Dr. Gershon Crane sent report in media  Due around Sept 12 2019  Checked for diabetic retinopathy   Will take your flu vaccine today   Abnormal Screens  Hearing has declined but states she has Tinnitus    Referrals  Declines to go to a podiatrist but would recommend   Patient concerns; Bladder is prolapsed but does not want to have this operated on Bowels; taking herb to assist with regularity   Meds are out   Nurse Concerns; Recheck A1c and hgb Agreed to stay for apt with Dr. Maudie Mercury today   Next PCP apt Today at 2:45      I have personally reviewed and noted the following in the patient's chart:   . Medical and social history . Use of alcohol, tobacco or illicit drugs  . Current medications and supplements . Functional ability and status . Nutritional status . Physical activity . Advanced directives . List of other physicians . Hospitalizations, surgeries, and ER visits in previous 12 months . Vitals . Screenings to include cognitive, depression, and falls . Referrals and appointments  In addition, I have reviewed and discussed with patient certain preventive protocols, quality metrics, and best practice recommendations. A written personalized care plan for preventive services as well as general preventive health recommendations were provided to patient.     HYIFO,YDXAJ, RN  12/21/2017

## 2017-12-21 ENCOUNTER — Ambulatory Visit (INDEPENDENT_AMBULATORY_CARE_PROVIDER_SITE_OTHER): Payer: Medicare HMO

## 2017-12-21 ENCOUNTER — Encounter: Payer: Self-pay | Admitting: Family Medicine

## 2017-12-21 ENCOUNTER — Ambulatory Visit (INDEPENDENT_AMBULATORY_CARE_PROVIDER_SITE_OTHER): Payer: Medicare HMO | Admitting: Family Medicine

## 2017-12-21 VITALS — BP 118/70 | HR 67 | Temp 98.3°F | Ht 62.5 in | Wt 150.6 lb

## 2017-12-21 DIAGNOSIS — Z0000 Encounter for general adult medical examination without abnormal findings: Secondary | ICD-10-CM

## 2017-12-21 DIAGNOSIS — Z23 Encounter for immunization: Secondary | ICD-10-CM

## 2017-12-21 DIAGNOSIS — I152 Hypertension secondary to endocrine disorders: Secondary | ICD-10-CM

## 2017-12-21 DIAGNOSIS — I10 Essential (primary) hypertension: Secondary | ICD-10-CM | POA: Diagnosis not present

## 2017-12-21 DIAGNOSIS — E1121 Type 2 diabetes mellitus with diabetic nephropathy: Secondary | ICD-10-CM

## 2017-12-21 DIAGNOSIS — E785 Hyperlipidemia, unspecified: Secondary | ICD-10-CM

## 2017-12-21 DIAGNOSIS — E119 Type 2 diabetes mellitus without complications: Secondary | ICD-10-CM | POA: Diagnosis not present

## 2017-12-21 DIAGNOSIS — E1169 Type 2 diabetes mellitus with other specified complication: Secondary | ICD-10-CM

## 2017-12-21 DIAGNOSIS — E1159 Type 2 diabetes mellitus with other circulatory complications: Secondary | ICD-10-CM

## 2017-12-21 LAB — HDL CHOLESTEROL: HDL: 36.6 mg/dL — ABNORMAL LOW (ref >39.00)

## 2017-12-21 LAB — BASIC METABOLIC PANEL
BUN: 22 mg/dL (ref 6–23)
CHLORIDE: 103 meq/L (ref 96–112)
CO2: 28 meq/L (ref 19–32)
Calcium: 10.2 mg/dL (ref 8.4–10.5)
Creatinine, Ser: 1.18 mg/dL (ref 0.40–1.20)
GFR: 47.2 mL/min — ABNORMAL LOW (ref >60.00)
GLUCOSE: 108 mg/dL — AB (ref 70–99)
Potassium: 4 mEq/L (ref 3.5–5.1)
Sodium: 138 mEq/L (ref 135–145)

## 2017-12-21 LAB — CBC
HEMATOCRIT: 38.9 % (ref 36.0–46.0)
HEMOGLOBIN: 13.1 g/dL (ref 12.0–15.0)
MCHC: 33.8 g/dL (ref 30.0–36.0)
MCV: 90.2 fl (ref 78.0–100.0)
Platelets: 249 10*3/uL (ref 150.0–400.0)
RBC: 4.31 Mil/uL (ref 3.87–5.11)
RDW: 12.9 % (ref 11.5–15.5)
WBC: 7.1 10*3/uL (ref 4.0–10.5)

## 2017-12-21 LAB — HEMOGLOBIN A1C: HEMOGLOBIN A1C: 6.1 % (ref 4.6–6.5)

## 2017-12-21 LAB — CHOLESTEROL, TOTAL: Cholesterol: 146 mg/dL (ref 0–200)

## 2017-12-21 MED ORDER — METOPROLOL TARTRATE 25 MG PO TABS: ORAL_TABLET | ORAL | 3 refills | Status: DC

## 2017-12-21 MED ORDER — SIMVASTATIN 40 MG PO TABS: 40.0000 mg | ORAL_TABLET | Freq: Every day | ORAL | 3 refills | Status: DC

## 2017-12-21 MED ORDER — LISINOPRIL-HYDROCHLOROTHIAZIDE 10-12.5 MG PO TABS: 1.0000 | ORAL_TABLET | Freq: Every day | ORAL | 3 refills | Status: DC

## 2017-12-21 NOTE — Progress Notes (Signed)
HPI:  Using dictation device. Unfortunately this device frequently misinterprets words/phrases.  Valerie Nguyen is a pleasant 77 y.o. here for follow up. Chronic medical problems summarized below were reviewed for changes and stability and were updated as needed below. These issues and their treatment remain stable for the most part.  She was here for an annual wellness visit today with Manuela Schwartz and we realize she has not had a follow-up in about a year.  She is due for lab work, her eye exam and her prevnar 13 vaccine. Denies CP, SOB, DOE, treatment intolerance or new symptoms. Reports has remained active, has been eating healthy and feels well.  No concerns today.  She needs refills on her medications.  Diet controlled DM: -w/ mild CKD -meds: asa -lifestlye: diet so so  HTN with diabetes: -meds: lisinopril-hctz, metoprolol tartrate -hx labile elevated HR and ups and downs in BP  HLD: -meds: simvastatin -stable  GERD: -meds: pepcid -stable fo a long time  Varicose veins/Thrombophlebitis: -seeing Dr. Sherren Mocha Early   ROS: See pertinent positives and negatives per HPI.  Past Medical History:  Diagnosis Date  . Borderline diabetes 06/22/2012  . Chronic constipation   . CKD (chronic kidney disease) 03/24/2012  . GERD (gastroesophageal reflux disease) 03/24/2012  . High cholesterol   . Hypertension   . Lower extremity edema 03/24/2012  . Varicose veins     Past Surgical History:  Procedure Laterality Date  . ABDOMINAL HYSTERECTOMY  1987  . BREAST EXCISIONAL BIOPSY Left   . COLONOSCOPY    . ENDOVENOUS ABLATION SAPHENOUS VEIN W/ LASER Right 08-22-2015   endovenous laser ablation right greater saphenous vein by Curt Jews MD   . extrraction of 1 wisdom tooth    . laser varicose veins    . LASIK      Family History  Problem Relation Age of Onset  . Breast cancer Mother   . Hypertension Mother   . Alzheimer's disease Mother   . Hypertension Father   . Bone cancer Brother    . Prostate cancer Brother   . Colon cancer Neg Hx   . Esophageal cancer Neg Hx   . Pancreatic cancer Neg Hx   . Rectal cancer Neg Hx   . Stomach cancer Neg Hx     SOCIAL HX: se ehpi   Current Outpatient Medications:  .  aspirin 81 MG tablet, Take 81 mg by mouth daily., Disp: , Rfl:  .  cholecalciferol (VITAMIN D) 1000 units tablet, Take 1,000 Units by mouth daily., Disp: , Rfl:  .  lisinopril-hydrochlorothiazide (PRINZIDE,ZESTORETIC) 10-12.5 MG tablet, TAKE 1 TABLET BY MOUTH DAILY, Disp: 90 tablet, Rfl: 0 .  metoprolol tartrate (LOPRESSOR) 25 MG tablet, TAKE 1 TABLET BY MOUTH DAILY, Disp: 90 tablet, Rfl: 0 .  OVER THE COUNTER MEDICATION, , Disp: , Rfl:  .  simvastatin (ZOCOR) 40 MG tablet, TAKE 1 TABLET BY MOUTH AT BEDTIME, Disp: 90 tablet, Rfl: 0  EXAM:  Vitals:   12/21/17 1403  BP: 118/70  Pulse: 67  Temp: 98.3 F (36.8 C)    Body mass index is 27.11 kg/m.  GENERAL: vitals reviewed and listed above, alert, oriented, appears well hydrated and in no acute distress  HEENT: atraumatic, conjunttiva clear, no obvious abnormalities on inspection of external nose and ears  NECK: no obvious masses on inspection  LUNGS: clear to auscultation bilaterally, no wheezes, rales or rhonchi, good air movement  CV: HRRR, no peripheral edema  MS: moves all extremities without noticeable abnormality  PSYCH: pleasant and cooperative, no obvious depression or anxiety  ASSESSMENT AND PLAN:  Discussed the following assessment and plan:  Diabetes mellitus without complication ([place]) - Plan: Hemoglobin A1c  Diabetic nephropathy associated with type 2 diabetes mellitus ([place]), Chronic  Hypertension associated with diabetes ([place]) - Plan: Basic metabolic panel, CBC  Hyperlipidemia associated with type 2 diabetes mellitus ([place]) - Plan: HDL cholesterol, Cholesterol, total  -advised continued healthy lifestyle and current med pending labs -labs today, not fasting -reviewed HM measures  due, she would like to get her prevnar 13 today -advised follow up q3-4 months and as needed  Patient Instructions  BEFORE YOU LEAVE: -prevnar 13 -labs -follow up: 4 months  We have ordered labs or studies at this visit. It can take up to 1-2 weeks for results and processing. IF results require follow up or explanation, we will call you with instructions. Clinically stable results will be released to your [facility]. If you have not heard from [place] or cannot find your results in [facility] in 2 weeks please contact our office at [phone].  If you are not yet signed up for [facility], please consider signing up.   We recommend the following healthy lifestyle for LIFE: 1) Small portions. But, make sure to get regular (at least 3 per day), healthy meals and small healthy snacks if needed.  2) Eat a healthy clean diet.   TRY TO EAT: -at least 5-7 servings of low sugar, colorful, and nutrient rich vegetables per day (not corn, potatoes or bananas.) -berries are the best choice if you wish to eat fruit (only eat small amounts if trying to reduce weight)  -lean meets (fish, white meat of chicken or [place]) -vegan proteins for some meals - beans or tofu, whole grains, nuts and seeds -Replace bad fats with good fats - good fats include: fish, nuts and seeds, canola oil, olive oil -small amounts of low fat or non fat dairy -small amounts of100 % whole grains - check the lables -drink plenty of water  AVOID: -SUGAR, sweets, anything with added sugar, corn syrup or sweeteners - must read labels as even foods advertised as "healthy" often are loaded with sugar -if you must have a sweetener, small amounts of stevia may be best -sweetened beverages and artificially sweetened beverages -simple starches (rice, bread, potatoes, pasta, chips, etc - small amounts of 100% whole grains are ok) -red meat, pork, butter -fried foods, fast food, processed food, excessive dairy, eggs and coconut.  3)Get at least  150 minutes of sweaty aerobic exercise per week.  4)Reduce stress - consider counseling, meditation and relaxation to balance other aspects of your life.          Lucretia Kern, DO

## 2017-12-21 NOTE — Patient Instructions (Addendum)
BEFORE YOU LEAVE: -prevnar 13 -labs -follow up: 4 months  Make sure to get a diabetic eye exam yearly or every 2 years depending on the eye doctor's recommendations.  We have ordered labs or studies at this visit. It can take up to 1-2 weeks for results and processing. IF results require follow up or explanation, we will call you with instructions. Clinically stable results will be released to your [facility]. If you have not heard from [place] or cannot find your results in [facility] in 2 weeks please contact our office at [phone].  If you are not yet signed up for [facility], please consider signing up.   We recommend the following healthy lifestyle for LIFE: 1) Small portions. But, make sure to get regular (at least 3 per day), healthy meals and small healthy snacks if needed.  2) Eat a healthy clean diet.   TRY TO EAT: -at least 5-7 servings of low sugar, colorful, and nutrient rich vegetables per day (not corn, potatoes or bananas.) -berries are the best choice if you wish to eat fruit (only eat small amounts if trying to reduce weight)  -lean meets (fish, white meat of chicken or [place]) -vegan proteins for some meals - beans or tofu, whole grains, nuts and seeds -Replace bad fats with good fats - good fats include: fish, nuts and seeds, canola oil, olive oil -small amounts of low fat or non fat dairy -small amounts of100 % whole grains - check the lables -drink plenty of water  AVOID: -SUGAR, sweets, anything with added sugar, corn syrup or sweeteners - must read labels as even foods advertised as "healthy" often are loaded with sugar -if you must have a sweetener, small amounts of stevia may be best -sweetened beverages and artificially sweetened beverages -simple starches (rice, bread, potatoes, pasta, chips, etc - small amounts of 100% whole grains are ok) -red meat, pork, butter -fried foods, fast food, processed food, excessive dairy, eggs and coconut.  3)Get at least 150  minutes of sweaty aerobic exercise per week.  4)Reduce stress - consider counseling, meditation and relaxation to balance other aspects of your life.

## 2017-12-21 NOTE — Progress Notes (Signed)
Saw pt after this visit. Lucretia Kern, DO

## 2017-12-21 NOTE — Patient Instructions (Addendum)
Valerie Nguyen , Thank you for taking time to come for your Medicare Wellness Visit. I appreciate your ongoing commitment to your health goals. Please review the following plan we discussed and let me know if I can assist you in the future.   Will schedule with Dr. Maudie Mercury (labs and medical fup ) when you leave today.   Will take your flu vaccine today  If you can bring in your Health POA for the records at your next visit, we can place this on the check   You can have a free hearing screen anywhere. Deaf [facility] Hard of Hearing [facility] Services - can assist with hearing aid x 1  No reviews  [employer] Office  [place] [address]  [phone] - [place]   http://clienthiadev.devcloud.acquia-sites.com/sites/default/files/hearingpedia/Guide_How_to_Buy_Hearing_Aids.pdf   [employer]; (504) [phone] [address] [place]; [phone] Get resource to get information on any and all community programs for Seniors  Community [employer]; "Aging Gracefully In Place" program; can request or apply  [place]: 306-506-2014 Community Health Response Program -314-970-2637 Public Health Dept; Need to be a skilled visit but can assist with bathing as well; [phone]      These are the goals we discussed: Goals    . patient     Maintaining her health Taking care of grand children        This is a list of the screening recommended for you and due dates:  Health Maintenance  Topic Date Due  . Hemoglobin A1C  06/16/2017  . Complete foot exam   08/13/2017  . Flu Shot  10/21/2017  . Eye exam for diabetics  11/26/2017  . Pneumonia vaccines (2 of 2 - PCV13) 06/12/2024*  . Mammogram  09/08/2018  . Tetanus Vaccine  07/07/2021  . DEXA scan (bone density measurement)  Completed  *Topic was postponed. The date shown is not the original due date.      Fall Prevention in the Home Falls can cause injuries. They can happen to people of all ages. There are many things you can do to make your home  safe and to help prevent falls. What can I do on the outside of my home?  Regularly fix the edges of walkways and driveways and fix any cracks.  Remove anything that might make you trip as you walk through a door, such as a raised step or threshold.  Trim any bushes or trees on the path to your home.  Use bright outdoor lighting.  Clear any walking paths of anything that might make someone trip, such as rocks or tools.  Regularly check to see if handrails are loose or broken. Make sure that both sides of any steps have handrails.  Any raised decks and porches should have guardrails on the edges.  Have any leaves, snow, or ice cleared regularly.  Use sand or salt on walking paths during winter.  Clean up any spills in your garage right away. This includes oil or grease spills. What can I do in the bathroom?  Use night lights.  Install grab bars by the toilet and in the tub and shower. Do not use towel bars as grab bars.  Use non-skid mats or decals in the tub or shower.  If you need to sit down in the shower, use a plastic, non-slip stool.  Keep the floor dry. Clean up any water that spills on the floor as soon as it happens.  Remove soap buildup in the tub or shower regularly.  Attach bath mats  securely with double-sided non-slip rug tape.  Do not have throw rugs and other things on the floor that can make you trip. What can I do in the bedroom?  Use night lights.  Make sure that you have a light by your bed that is easy to reach.  Do not use any sheets or blankets that are too big for your bed. They should not hang down onto the floor.  Have a firm chair that has side arms. You can use this for support while you get dressed.  Do not have throw rugs and other things on the floor that can make you trip. What can I do in the kitchen?  Clean up any spills right away.  Avoid walking on wet floors.  Keep items that you use a lot in easy-to-reach places.  If you  need to reach something above you, use a strong step stool that has a grab bar.  Keep electrical cords out of the way.  Do not use floor polish or wax that makes floors slippery. If you must use wax, use non-skid floor wax.  Do not have throw rugs and other things on the floor that can make you trip. What can I do with my stairs?  Do not leave any items on the stairs.  Make sure that there are handrails on both sides of the stairs and use them. Fix handrails that are broken or loose. Make sure that handrails are as long as the stairways.  Check any carpeting to make sure that it is firmly attached to the stairs. Fix any carpet that is loose or worn.  Avoid having throw rugs at the top or bottom of the stairs. If you do have throw rugs, attach them to the floor with carpet tape.  Make sure that you have a light switch at the top of the stairs and the bottom of the stairs. If you do not have them, ask someone to add them for you. What else can I do to help prevent falls?  Wear shoes that: ? Do not have high heels. ? Have rubber bottoms. ? Are comfortable and fit you well. ? Are closed at the toe. Do not wear sandals.  If you use a stepladder: ? Make sure that it is fully opened. Do not climb a closed stepladder. ? Make sure that both sides of the stepladder are locked into place. ? Ask someone to hold it for you, if possible.  Clearly mark and make sure that you can see: ? Any grab bars or handrails. ? First and last steps. ? Where the edge of each step is.  Use tools that help you move around (mobility aids) if they are needed. These include: ? Canes. ? Walkers. ? Scooters. ? Crutches.  Turn on the lights when you go into a dark area. Replace any light bulbs as soon as they burn out.  Set up your furniture so you have a clear path. Avoid moving your furniture around.  If any of your floors are uneven, fix them.  If there are any pets around you, be aware of where they  are.  Review your medicines with your doctor. Some medicines can make you feel dizzy. This can increase your chance of falling. Ask your doctor what other things that you can do to help prevent falls. This information is not intended to replace advice given to you by your health care provider. Make sure you discuss any questions you have with your health  care provider. Document Released: 01/03/2009 Document Revised: 08/15/2015 Document Reviewed: 04/13/2014 Elsevier Interactive Patient Education  [address] Maintenance, Female Adopting a healthy lifestyle and getting preventive care can go a long way to promote health and wellness. Talk with your health care provider about what schedule of regular examinations is right for you. This is a good chance for you to check in with your provider about disease prevention and staying healthy. In between checkups, there are plenty of things you can do on your own. Experts have done a lot of research about which lifestyle changes and preventive measures are most likely to keep you healthy. Ask your health care provider for more information. Weight and diet Eat a healthy diet  Be sure to include plenty of vegetables, fruits, low-fat dairy products, and lean protein.  Do not eat a lot of foods high in solid fats, added sugars, or salt.  Get regular exercise. This is one of the most important things you can do for your health. ? Most adults should exercise for at least 150 minutes each week. The exercise should increase your heart rate and make you sweat (moderate-intensity exercise). ? Most adults should also do strengthening exercises at least twice a week. This is in addition to the moderate-intensity exercise.  Maintain a healthy weight  Body mass index (BMI) is a measurement that can be used to identify possible weight problems. It estimates body fat based on height and weight. Your health care provider can help determine your BMI and  help you achieve or maintain a healthy weight.  For females 39 years of age and older: ? A BMI below 18.5 is considered underweight. ? A BMI of 18.5 to 24.9 is normal. ? A BMI of 25 to 29.9 is considered overweight. ? A BMI of 30 and above is considered obese.  Watch levels of cholesterol and blood lipids  You should start having your blood tested for lipids and cholesterol at 77 years of age, then have this test every 5 years.  You may need to have your cholesterol levels checked more often if: ? Your lipid or cholesterol levels are high. ? You are older than 77 years of age. ? You are at high risk for heart disease.  Cancer screening Lung Cancer  Lung cancer screening is recommended for adults 37-25 years old who are at high risk for lung cancer because of a history of smoking.  A yearly low-dose CT scan of the lungs is recommended for people who: ? Currently smoke. ? Have quit within the past 15 years. ? Have at least a 30-pack-year history of smoking. A pack year is smoking an average of one pack of cigarettes a day for 1 year.  Yearly screening should continue until it has been 15 years since you quit.  Yearly screening should stop if you develop a health problem that would prevent you from having lung cancer treatment.  Breast Cancer  Practice breast self-awareness. This means understanding how your breasts normally appear and feel.  It also means doing regular breast self-exams. Let your health care provider know about any changes, no matter how small.  If you are in your 20s or 30s, you should have a clinical breast exam (CBE) by a health care provider every 1-3 years as part of a regular health exam.  If you are 47 or older, have a CBE every year. Also consider having a breast X-ray (mammogram) every year.  If you have a family  history of breast cancer, talk to your health care provider about genetic screening.  If you are at high risk for breast cancer, talk to  your health care provider about having an MRI and a mammogram every year.  Breast cancer gene (BRCA) assessment is recommended for women who have family members with BRCA-related cancers. BRCA-related cancers include: ? Breast. ? Ovarian. ? Tubal. ? Peritoneal cancers.  Results of the assessment will determine the need for genetic counseling and BRCA1 and BRCA2 testing.  Cervical Cancer Your health care provider may recommend that you be screened regularly for cancer of the pelvic organs (ovaries, uterus, and vagina). This screening involves a pelvic examination, including checking for microscopic changes to the surface of your cervix (Pap test). You may be encouraged to have this screening done every 3 years, beginning at age 74.  For women ages 82-65, health care providers may recommend pelvic exams and Pap testing every 3 years, or they may recommend the Pap and pelvic exam, combined with testing for human papilloma virus (HPV), every 5 years. Some types of HPV increase your risk of cervical cancer. Testing for HPV may also be done on women of any age with unclear Pap test results.  Other health care providers may not recommend any screening for nonpregnant women who are considered low risk for pelvic cancer and who do not have symptoms. Ask your health care provider if a screening pelvic exam is right for you.  If you have had past treatment for cervical cancer or a condition that could lead to cancer, you need Pap tests and screening for cancer for at least 20 years after your treatment. If Pap tests have been discontinued, your risk factors (such as having a new sexual partner) need to be reassessed to determine if screening should resume. Some women have medical problems that increase the chance of getting cervical cancer. In these cases, your health care provider may recommend more frequent screening and Pap tests.  Colorectal Cancer  This type of cancer can be detected and often  prevented.  Routine colorectal cancer screening usually begins at 77 years of age and continues through 77 years of age.  Your health care provider may recommend screening at an earlier age if you have risk factors for colon cancer.  Your health care provider may also recommend using home test kits to check for hidden blood in the stool.  A small camera at the end of a tube can be used to examine your colon directly (sigmoidoscopy or colonoscopy). This is done to check for the earliest forms of colorectal cancer.  Routine screening usually begins at age 28.  Direct examination of the colon should be repeated every 5-10 years through 77 years of age. However, you may need to be screened more often if early forms of precancerous polyps or small growths are found.  Skin Cancer  Check your skin from head to toe regularly.  Tell your health care provider about any new moles or changes in moles, especially if there is a change in a mole's shape or color.  Also tell your health care provider if you have a mole that is larger than the size of a pencil eraser.  Always use sunscreen. Apply sunscreen liberally and repeatedly throughout the day.  Protect yourself by wearing long sleeves, pants, a wide-brimmed hat, and sunglasses whenever you are outside.  Heart disease, diabetes, and high blood pressure  High blood pressure causes heart disease and increases the risk of stroke.  High blood pressure is more likely to develop in: ? People who have blood pressure in the high end of the normal range (130-139/85-89 mm Hg). ? People who are overweight or obese. ? People who are African American.  If you are 38-29 years of age, have your blood pressure checked every 3-5 years. If you are 87 years of age or older, have your blood pressure checked every year. You should have your blood pressure measured twice-once when you are at a hospital or clinic, and once when you are not at a hospital or clinic.  Record the average of the two measurements. To check your blood pressure when you are not at a hospital or clinic, you can use: ? An automated blood pressure machine at a pharmacy. ? A home blood pressure monitor.  If you are between 3 years and 19 years old, ask your health care provider if you should take aspirin to prevent strokes.  Have regular diabetes screenings. This involves taking a blood sample to check your fasting blood sugar level. ? If you are at a normal weight and have a low risk for diabetes, have this test once every three years after 77 years of age. ? If you are overweight and have a high risk for diabetes, consider being tested at a younger age or more often. Preventing infection Hepatitis B  If you have a higher risk for hepatitis B, you should be screened for this virus. You are considered at high risk for hepatitis B if: ? You were born in a country where hepatitis B is common. Ask your health care provider which countries are considered high risk. ? Your parents were born in a high-risk country, and you have not been immunized against hepatitis B (hepatitis B vaccine). ? You have HIV or AIDS. ? You use needles to inject street drugs. ? You live with someone who has hepatitis B. ? You have had sex with someone who has hepatitis B. ? You get hemodialysis treatment. ? You take certain medicines for conditions, including cancer, organ transplantation, and autoimmune conditions.  Hepatitis C  Blood testing is recommended for: ? Everyone born from [birth year] through 1965. ? Anyone with known risk factors for hepatitis C.  Sexually transmitted infections (STIs)  You should be screened for sexually transmitted infections (STIs) including gonorrhea and chlamydia if: ? You are sexually active and are younger than 77 years of age. ? You are older than 77 years of age and your health care provider tells you that you are at risk for this type of infection. ? Your sexual  activity has changed since you were last screened and you are at an increased risk for chlamydia or gonorrhea. Ask your health care provider if you are at risk.  If you do not have HIV, but are at risk, it may be recommended that you take a prescription medicine daily to prevent HIV infection. This is called pre-exposure prophylaxis (PrEP). You are considered at risk if: ? You are sexually active and do not regularly use condoms or know the HIV status of your partner(s). ? You take drugs by injection. ? You are sexually active with a partner who has HIV.  Talk with your health care provider about whether you are at high risk of being infected with HIV. If you choose to begin PrEP, you should first be tested for HIV. You should then be tested every 3 months for as long as you are taking PrEP. Pregnancy  If you  are premenopausal and you may become pregnant, ask your health care provider about preconception counseling.  If you may become pregnant, take 400 to 800 micrograms (mcg) of folic acid every day.  If you want to prevent pregnancy, talk to your health care provider about birth control (contraception). Osteoporosis and menopause  Osteoporosis is a disease in which the bones lose minerals and strength with aging. This can result in serious bone fractures. Your risk for osteoporosis can be identified using a bone density scan.  If you are 70 years of age or older, or if you are at risk for osteoporosis and fractures, ask your health care provider if you should be screened.  Ask your health care provider whether you should take a calcium or vitamin D supplement to lower your risk for osteoporosis.  Menopause may have certain physical symptoms and risks.  Hormone replacement therapy may reduce some of these symptoms and risks. Talk to your health care provider about whether hormone replacement therapy is right for you. Follow these instructions at home:  Schedule regular health, dental,  and eye exams.  Stay current with your immunizations.  Do not use any tobacco products including cigarettes, chewing tobacco, or electronic cigarettes.  If you are pregnant, do not drink alcohol.  If you are breastfeeding, limit how much and how often you drink alcohol.  Limit alcohol intake to no more than 1 drink per day for nonpregnant women. One drink equals 12 ounces of beer, 5 ounces of wine, or 1 ounces of hard liquor.  Do not use street drugs.  Do not share needles.  Ask your health care provider for help if you need support or information about quitting drugs.  Tell your health care provider if you often feel depressed.  Tell your health care provider if you have ever been abused or do not feel safe at home. This information is not intended to replace advice given to you by your health care provider. Make sure you discuss any questions you have with your health care provider. Document Released: 09/22/2010 Document Revised: 08/15/2015 Document Reviewed: 12/11/2014 Elsevier Interactive Patient Education  [employer].

## 2017-12-21 NOTE — Addendum Note (Signed)
Addended by: Agnes Lawrence on: 12/21/2017 02:42 PM   Modules accepted: Orders

## 2018-04-26 ENCOUNTER — Ambulatory Visit (INDEPENDENT_AMBULATORY_CARE_PROVIDER_SITE_OTHER): Payer: Medicare HMO | Admitting: Family Medicine

## 2018-04-26 ENCOUNTER — Encounter: Payer: Self-pay | Admitting: Family Medicine

## 2018-04-26 VITALS — BP 120/78 | HR 59 | Temp 98.3°F | Ht 62.5 in | Wt 151.2 lb

## 2018-04-26 DIAGNOSIS — E785 Hyperlipidemia, unspecified: Secondary | ICD-10-CM

## 2018-04-26 DIAGNOSIS — I152 Hypertension secondary to endocrine disorders: Secondary | ICD-10-CM

## 2018-04-26 DIAGNOSIS — Z6827 Body mass index (BMI) 27.0-27.9, adult: Secondary | ICD-10-CM | POA: Diagnosis not present

## 2018-04-26 DIAGNOSIS — E1122 Type 2 diabetes mellitus with diabetic chronic kidney disease: Secondary | ICD-10-CM | POA: Diagnosis not present

## 2018-04-26 DIAGNOSIS — E1121 Type 2 diabetes mellitus with diabetic nephropathy: Secondary | ICD-10-CM | POA: Diagnosis not present

## 2018-04-26 DIAGNOSIS — I10 Essential (primary) hypertension: Secondary | ICD-10-CM | POA: Diagnosis not present

## 2018-04-26 DIAGNOSIS — E1169 Type 2 diabetes mellitus with other specified complication: Secondary | ICD-10-CM | POA: Diagnosis not present

## 2018-04-26 DIAGNOSIS — E1159 Type 2 diabetes mellitus with other circulatory complications: Secondary | ICD-10-CM | POA: Diagnosis not present

## 2018-04-26 NOTE — Progress Notes (Signed)
HPI:  Using dictation device. Unfortunately this device frequently misinterprets words/phrases.  Georgiann Neider is a pleasant 78 y.o. here for follow up. Chronic medical problems summarized below were reviewed for changes and stability and were updated as needed below. These issues and their treatment remain stable for the most part.  Reports doing great. Exercising on treadmill 30 minutes per day. Diet so so, but not terrible. Prefers to skip labs today and do next time as feels great and in a hurry. Denies CP, SOB, DOE, treatment intolerance or new symptoms.  Diet controlled DM: -w/ mild CKD, HTN, HLD -meds: asa -lifestlye: diet so so  HTN with diabetes: -meds: lisinopril-hctz, metoprolol tartrate -hx labile elevated HR and ups and downs in BP  HLD: -meds: simvastatin -stable  GERD: -meds: pepcid -stablefo a long time  Varicose veins/Thrombophlebitis: -seeing Dr. Sherren Mocha Early  ROS: See pertinent positives and negatives per HPI.  Past Medical History:  Diagnosis Date  . Borderline diabetes 06/22/2012  . Chronic constipation   . CKD (chronic kidney disease) 03/24/2012  . GERD (gastroesophageal reflux disease) 03/24/2012  . High cholesterol   . Hypertension   . Lower extremity edema 03/24/2012  . Varicose veins     Past Surgical History:  Procedure Laterality Date  . ABDOMINAL HYSTERECTOMY  1987  . BREAST EXCISIONAL BIOPSY Left   . COLONOSCOPY    . ENDOVENOUS ABLATION SAPHENOUS VEIN W/ LASER Right 08-22-2015   endovenous laser ablation right greater saphenous vein by Curt Jews MD   . extrraction of 1 wisdom tooth    . laser varicose veins    . LASIK      Family History  Problem Relation Age of Onset  . Breast cancer Mother   . Hypertension Mother   . Alzheimer's disease Mother   . Hypertension Father   . Bone cancer Brother   . Prostate cancer Brother   . Colon cancer Neg Hx   . Esophageal cancer Neg Hx   . Pancreatic cancer Neg Hx   . Rectal cancer Neg Hx    . Stomach cancer Neg Hx     SOCIAL HX: see hpi   Current Outpatient Medications:  .  aspirin 81 MG tablet, Take 81 mg by mouth daily., Disp: , Rfl:  .  cholecalciferol (VITAMIN D) 1000 units tablet, Take 1,000 Units by mouth daily., Disp: , Rfl:  .  lisinopril-hydrochlorothiazide (PRINZIDE,ZESTORETIC) 10-12.5 MG tablet, Take 1 tablet by mouth daily., Disp: 90 tablet, Rfl: 3 .  metoprolol tartrate (LOPRESSOR) 25 MG tablet, TAKE 1 TABLET BY MOUTH DAILY, Disp: 90 tablet, Rfl: 3 .  OVER THE COUNTER MEDICATION, , Disp: , Rfl:  .  simvastatin (ZOCOR) 40 MG tablet, Take 1 tablet (40 mg total) by mouth at bedtime., Disp: 90 tablet, Rfl: 3  EXAM:  Vitals:   04/26/18 1337  BP: 120/78  Pulse: (!) 59  Temp: 98.3 F (36.8 C)    Body mass index is 27.21 kg/m.  GENERAL: vitals reviewed and listed above, alert, oriented, appears well hydrated and in no acute distress  HEENT: atraumatic, conjunttiva clear, no obvious abnormalities on inspection of external nose and ears  NECK: no obvious masses on inspection  LUNGS: clear to auscultation bilaterally, no wheezes, rales or rhonchi, good air movement  CV: HRRR, no peripheral edema  MS: moves all extremities without noticeable abnormality  PSYCH: pleasant and cooperative, no obvious depression or anxiety  ASSESSMENT AND PLAN:  Discussed the following assessment and plan:  Type 2 diabetes mellitus  with chronic kidney disease, without long-term current use of insulin, unspecified CKD stage ([place])  Hypertension associated with diabetes ([place])  Hyperlipidemia associated with type 2 diabetes mellitus ([place])  Diabetic nephropathy associated with type 2 diabetes mellitus ([place])  BMI 27.0-27.9,adult  -congratulated and encouraged on healthier lifestyle, encouraged low sugar diet -they prefer to not do labs today as they are in a hurry - plan to do at next visit -cont current meds -follow up in 3-4 months advised, sooner as needed  They  declined AVS There are no Patient Instructions on file for this visit.  Lucretia Kern, DO

## 2018-08-25 ENCOUNTER — Ambulatory Visit: Payer: Medicare HMO | Admitting: Family Medicine

## 2018-12-06 DIAGNOSIS — E119 Type 2 diabetes mellitus without complications: Secondary | ICD-10-CM | POA: Diagnosis not present

## 2018-12-06 DIAGNOSIS — Z961 Presence of intraocular lens: Secondary | ICD-10-CM | POA: Diagnosis not present

## 2018-12-06 LAB — HM DIABETES EYE EXAM

## 2018-12-22 ENCOUNTER — Encounter: Payer: Self-pay | Admitting: Family Medicine

## 2019-01-24 ENCOUNTER — Other Ambulatory Visit: Payer: Self-pay | Admitting: Family Medicine

## 2019-02-09 DIAGNOSIS — R69 Illness, unspecified: Secondary | ICD-10-CM | POA: Diagnosis not present

## 2019-02-27 ENCOUNTER — Other Ambulatory Visit: Payer: Self-pay | Admitting: Family Medicine

## 2019-03-01 ENCOUNTER — Other Ambulatory Visit: Payer: Self-pay | Admitting: Family Medicine

## 2019-03-01 NOTE — Telephone Encounter (Signed)
Requested medication (s) are due for refill today:yes  Requested medication (s) are on the active medication list: yes  Last refill:  01/24/2019  Future visit scheduled: yes  Notes to clinic:  Patient has a upcoming appointment  Review for refill    Requested Prescriptions  Pending Prescriptions Disp Refills   simvastatin (ZOCOR) 40 MG tablet 30 tablet 0    Sig: Take 1 tablet (40 mg total) by mouth at bedtime.     Cardiovascular:  Antilipid - Statins Failed - 03/01/2019  2:58 PM      Failed - Total Cholesterol in normal range and within 360 days    Cholesterol  Date Value Ref Range Status  12/21/2017 146 0 - 200 mg/dL Final    Comment:    ATP III Classification       Desirable:  < 200 mg/dL               Borderline High:  200 - 239 mg/dL          High:  > = 240 mg/dL         Failed - LDL in normal range and within 360 days    LDL Cholesterol  Date Value Ref Range Status  06/20/2015 90 0 - 99 mg/dL Final         Failed - HDL in normal range and within 360 days    HDL  Date Value Ref Range Status  12/21/2017 36.60 (L) >39.00 mg/dL Final         Failed - Triglycerides in normal range and within 360 days    Triglycerides  Date Value Ref Range Status  06/20/2015 87.0 0.0 - 149.0 mg/dL Final    Comment:    Normal:  <150 mg/dLBorderline High:  150 - 199 mg/dL         Passed - Patient is not pregnant      Passed - Valid encounter within last 12 months    Recent Outpatient Visits          10 months ago Type 2 diabetes mellitus with chronic kidney disease, without long-term current use of insulin, unspecified CKD stage ([place])   [facility], [place], DO   1 year ago Diabetes mellitus without complication ([place])   Therapist[facility] at [facility], Nickola Major, DO   2 years ago Encounter for preventative adult health care examination   Therapist, [facility] at [facility], [place], DO   2 years ago Diet-controlled diabetes mellitus ([place])   Therapist, [facility] at [facility], [place], DO   3 years ago Diet-controlled diabetes mellitus ([place])   Therapist, [facility] at [facility], [employer], DO      Future Appointments            In 6 days Maudie Mercury, Nickola Major, DO [place] [facility] [place], [place]            metoprolol tartrate (LOPRESSOR) 25 MG tablet 30 tablet 0    Sig: TAKE ONE TABLET BY MOUTH ONE TIME DAILY     Cardiovascular:  Beta Blockers Failed - 03/01/2019  2:58 PM      Failed - Valid encounter within last 6 months    Recent Outpatient Visits          10 months ago Type 2 diabetes mellitus with chronic kidney disease, without long-term current use of insulin, unspecified CKD stage ([place])   [place] [facility], [place], DO   1 year  ago Diabetes mellitus without complication ([place])   Therapist, [facility] at [facility], Nickola Major, DO   2 years ago Encounter for preventative adult health care examination   Therapist, music at [facility], [place], DO   2 years ago Diet-controlled diabetes mellitus ([place])   Therapist, music at [facility], [place], DO   3 years ago Diet-controlled diabetes mellitus ([place])   Therapist, music at [facility], [employer], DO      Future Appointments            In 6 days Lucretia Kern, DO [employer] at [place], [place] BP in normal range    BP Readings from Last 1 Encounters:  04/26/18 120/78         Passed - Last Heart Rate in normal range    Pulse Readings from Last 1 Encounters:  04/26/18 (!) 59

## 2019-03-01 NOTE — Telephone Encounter (Signed)
Medication Refill - Medication:  simvastatin (ZOCOR) 40 MG tablet metoprolol tartrate (LOPRESSOR) 25 MG tablet  Has the patient contacted their pharmacy?  Yes advised to call. Pt does have virtual visit scheduled for 12/15 for med refill.  Preferred Pharmacy (with phone number or street name):  [facility] [id number] [place] [phone] (Phone) [phone] (Fax)   Agent: Please be advised that RX refills may take up to 3 business days. We ask that you follow-up with your pharmacy.

## 2019-03-02 MED ORDER — SIMVASTATIN 40 MG PO TABS: 40.0000 mg | ORAL_TABLET | Freq: Every day | ORAL | 0 refills | Status: DC

## 2019-03-02 MED ORDER — METOPROLOL TARTRATE 25 MG PO TABS: ORAL_TABLET | ORAL | 0 refills | Status: DC

## 2019-03-07 ENCOUNTER — Telehealth (INDEPENDENT_AMBULATORY_CARE_PROVIDER_SITE_OTHER): Payer: Medicare HMO | Admitting: Family Medicine

## 2019-03-07 ENCOUNTER — Other Ambulatory Visit: Payer: Self-pay

## 2019-03-07 ENCOUNTER — Encounter: Payer: Self-pay | Admitting: Family Medicine

## 2019-03-07 DIAGNOSIS — E1159 Type 2 diabetes mellitus with other circulatory complications: Secondary | ICD-10-CM

## 2019-03-07 DIAGNOSIS — E785 Hyperlipidemia, unspecified: Secondary | ICD-10-CM | POA: Diagnosis not present

## 2019-03-07 DIAGNOSIS — E119 Type 2 diabetes mellitus without complications: Secondary | ICD-10-CM

## 2019-03-07 DIAGNOSIS — I10 Essential (primary) hypertension: Secondary | ICD-10-CM

## 2019-03-07 DIAGNOSIS — E1121 Type 2 diabetes mellitus with diabetic nephropathy: Secondary | ICD-10-CM | POA: Diagnosis not present

## 2019-03-07 DIAGNOSIS — I152 Hypertension secondary to endocrine disorders: Secondary | ICD-10-CM

## 2019-03-07 MED ORDER — LISINOPRIL-HYDROCHLOROTHIAZIDE 10-12.5 MG PO TABS: 1.0000 | ORAL_TABLET | Freq: Every day | ORAL | 1 refills | Status: DC

## 2019-03-07 MED ORDER — METOPROLOL TARTRATE 25 MG PO TABS
ORAL_TABLET | ORAL | 1 refills | Status: DC
Start: 2019-03-07 — End: 2023-11-25

## 2019-03-07 MED ORDER — SIMVASTATIN 40 MG PO TABS: 40.0000 mg | ORAL_TABLET | Freq: Every day | ORAL | 1 refills | Status: AC

## 2019-03-07 NOTE — Progress Notes (Signed)
Virtual Visit via Video Note  I connected with Valerie Nguyen  on 03/07/19 at 10:20 AM EST by a video enabled telemedicine application and verified that I am speaking with the correct person using two identifiers.  Location patient: home Location provider:work or home office Persons participating in the virtual visit: patient, provider  I discussed the limitations of evaluation and management by telemedicine and the availability of in person appointments. The patient expressed understanding and agreed to proceed.   HPI:  Seen in follow up. Conditions reviewed and updated below. They report she has been doing well and is without any complaints. She will be getting back in with her prior PCP at [facility] in March. Requesting refills on medications until then. They are trying to stay in given the [place] pandemic and multiple high risk household members. Has treadmill and is exercising 30 minutes per day. Reports BP has been good.   Diet controlled DM: -w/ mild CKD, HTN, HLD -meds: asa -lifestlye: diet so so -did diabetic eye exam 11/2018 with Dr. Gershon Crane, no retinopathy  HTN with diabetes: -meds: lisinopril-hctz, metoprolol tartrate -hx labile elevated HR and ups and downs in BP  HLD: -meds: simvastatin -stable  GERD: -meds: pepcid -stablefo a long time  Varicose veins/Thrombophlebitis: -seeing Dr. Sherren Mocha Early  ROS: See pertinent positives and negatives per HPI.  Past Medical History:  Diagnosis Date  . Borderline diabetes 06/22/2012  . Chronic constipation   . CKD (chronic kidney disease) 03/24/2012  . GERD (gastroesophageal reflux disease) 03/24/2012  . High cholesterol   . Hypertension   . Lower extremity edema 03/24/2012  . Varicose veins     Past Surgical History:  Procedure Laterality Date  . ABDOMINAL HYSTERECTOMY  1987  . BREAST EXCISIONAL BIOPSY Left   . COLONOSCOPY    . ENDOVENOUS ABLATION SAPHENOUS VEIN W/ LASER Right 08-22-2015   endovenous laser ablation right  greater saphenous vein by Curt Jews MD   . extrraction of 1 wisdom tooth    . laser varicose veins    . LASIK      Family History  Problem Relation Age of Onset  . Breast cancer Mother   . Hypertension Mother   . Alzheimer's disease Mother   . Hypertension Father   . Bone cancer Brother   . Prostate cancer Brother   . Colon cancer Neg Hx   . Esophageal cancer Neg Hx   . Pancreatic cancer Neg Hx   . Rectal cancer Neg Hx   . Stomach cancer Neg Hx     SOCIAL HX: see hpi   Current Outpatient Medications:  .  aspirin 81 MG tablet, Take 81 mg by mouth daily., Disp: , Rfl:  .  cholecalciferol (VITAMIN D) 1000 units tablet, Take 1,000 Units by mouth daily., Disp: , Rfl:  .  lisinopril-hydrochlorothiazide (ZESTORETIC) 10-12.5 MG tablet, Take 1 tablet by mouth daily., Disp: 90 tablet, Rfl: 1 .  metoprolol tartrate (LOPRESSOR) 25 MG tablet, TAKE ONE TABLET BY MOUTH ONE TIME DAILY, Disp: 90 tablet, Rfl: 1 .  OVER THE COUNTER MEDICATION, , Disp: , Rfl:  .  simvastatin (ZOCOR) 40 MG tablet, Take 1 tablet (40 mg total) by mouth at bedtime., Disp: 90 tablet, Rfl: 1  EXAM:  VITALS per patient if applicable:  GENERAL: alert, oriented, appears well and in no acute distress  HEENT: atraumatic, conjunttiva clear, no obvious abnormalities on inspection of external nose and ears  NECK: normal movements of the head and neck  LUNGS: on inspection no signs  of respiratory distress, breathing rate appears normal, no obvious gross SOB, gasping or wheezing  CV: no obvious cyanosis  MS: moves all visible extremities without noticeable abnormality  PSYCH/NEURO: pleasant and cooperative, no obvious depression or anxiety, speech and thought processing grossly intact  ASSESSMENT AND PLAN:  Discussed the following assessment and plan:  Hyperlipidemia, unspecified hyperlipidemia type - Plan: Lipid Panel  Hypertension associated with diabetes ([place]) - Plan: Basic Metabolic Panel, CBC (no  diff)  Diabetes mellitus without complication ([place]) - Plan: Hemoglobin A1c  Diabetic nephropathy associated with type 2 diabetes mellitus ([place])  -reports doing well -they have appt with new PCP -agreed to labs here as due and orders placed -refills provided -HM reviewed -follow up as needed pending new PCP appt   I discussed the assessment and treatment plan with the patient. The patient was provided an opportunity to ask questions and all were answered. The patient agreed with the plan and demonstrated an understanding of the instructions.   The patient was advised to call back or seek an in-person evaluation if the symptoms worsen or if the condition fails to improve as anticipated.   Lucretia Kern, DO

## 2019-06-08 DIAGNOSIS — Z0000 Encounter for general adult medical examination without abnormal findings: Secondary | ICD-10-CM | POA: Diagnosis not present

## 2019-06-08 DIAGNOSIS — E7800 Pure hypercholesterolemia, unspecified: Secondary | ICD-10-CM | POA: Diagnosis not present

## 2019-06-08 DIAGNOSIS — Z1389 Encounter for screening for other disorder: Secondary | ICD-10-CM | POA: Diagnosis not present

## 2019-06-08 DIAGNOSIS — I10 Essential (primary) hypertension: Secondary | ICD-10-CM | POA: Diagnosis not present

## 2019-06-08 DIAGNOSIS — N8110 Cystocele, unspecified: Secondary | ICD-10-CM | POA: Diagnosis not present

## 2019-06-08 DIAGNOSIS — I2510 Atherosclerotic heart disease of native coronary artery without angina pectoris: Secondary | ICD-10-CM | POA: Diagnosis not present

## 2019-08-03 DIAGNOSIS — E7800 Pure hypercholesterolemia, unspecified: Secondary | ICD-10-CM | POA: Diagnosis not present

## 2019-08-23 DIAGNOSIS — N8111 Cystocele, midline: Secondary | ICD-10-CM | POA: Diagnosis not present

## 2019-08-23 DIAGNOSIS — N3946 Mixed incontinence: Secondary | ICD-10-CM | POA: Diagnosis not present

## 2019-08-23 DIAGNOSIS — N993 Prolapse of vaginal vault after hysterectomy: Secondary | ICD-10-CM | POA: Diagnosis not present

## 2019-08-23 DIAGNOSIS — K5902 Outlet dysfunction constipation: Secondary | ICD-10-CM | POA: Diagnosis not present

## 2019-08-23 DIAGNOSIS — N952 Postmenopausal atrophic vaginitis: Secondary | ICD-10-CM | POA: Diagnosis not present

## 2019-08-23 DIAGNOSIS — K5900 Constipation, unspecified: Secondary | ICD-10-CM | POA: Diagnosis not present

## 2019-08-23 DIAGNOSIS — R54 Age-related physical debility: Secondary | ICD-10-CM | POA: Diagnosis not present

## 2019-12-08 DIAGNOSIS — E119 Type 2 diabetes mellitus without complications: Secondary | ICD-10-CM | POA: Diagnosis not present

## 2019-12-08 DIAGNOSIS — Z961 Presence of intraocular lens: Secondary | ICD-10-CM | POA: Diagnosis not present

## 2019-12-08 DIAGNOSIS — H524 Presbyopia: Secondary | ICD-10-CM | POA: Diagnosis not present

## 2019-12-08 LAB — HM DIABETES EYE EXAM

## 2019-12-11 ENCOUNTER — Encounter: Payer: Self-pay | Admitting: General Practice

## 2019-12-11 DIAGNOSIS — E7800 Pure hypercholesterolemia, unspecified: Secondary | ICD-10-CM | POA: Diagnosis not present

## 2019-12-11 DIAGNOSIS — E559 Vitamin D deficiency, unspecified: Secondary | ICD-10-CM | POA: Diagnosis not present

## 2019-12-11 DIAGNOSIS — R413 Other amnesia: Secondary | ICD-10-CM | POA: Diagnosis not present

## 2019-12-11 DIAGNOSIS — M170 Bilateral primary osteoarthritis of knee: Secondary | ICD-10-CM | POA: Diagnosis not present

## 2019-12-11 DIAGNOSIS — I2510 Atherosclerotic heart disease of native coronary artery without angina pectoris: Secondary | ICD-10-CM | POA: Diagnosis not present

## 2019-12-11 DIAGNOSIS — I10 Essential (primary) hypertension: Secondary | ICD-10-CM | POA: Diagnosis not present

## 2020-06-12 DIAGNOSIS — Z1389 Encounter for screening for other disorder: Secondary | ICD-10-CM | POA: Diagnosis not present

## 2020-06-12 DIAGNOSIS — I10 Essential (primary) hypertension: Secondary | ICD-10-CM | POA: Diagnosis not present

## 2020-06-12 DIAGNOSIS — Z0000 Encounter for general adult medical examination without abnormal findings: Secondary | ICD-10-CM | POA: Diagnosis not present

## 2020-06-12 DIAGNOSIS — E7800 Pure hypercholesterolemia, unspecified: Secondary | ICD-10-CM | POA: Diagnosis not present

## 2020-06-12 DIAGNOSIS — R413 Other amnesia: Secondary | ICD-10-CM | POA: Diagnosis not present

## 2020-06-12 DIAGNOSIS — Z23 Encounter for immunization: Secondary | ICD-10-CM | POA: Diagnosis not present

## 2020-06-12 DIAGNOSIS — I2510 Atherosclerotic heart disease of native coronary artery without angina pectoris: Secondary | ICD-10-CM | POA: Diagnosis not present

## 2020-12-19 DIAGNOSIS — R413 Other amnesia: Secondary | ICD-10-CM | POA: Diagnosis not present

## 2020-12-19 DIAGNOSIS — R69 Illness, unspecified: Secondary | ICD-10-CM | POA: Diagnosis not present

## 2020-12-19 DIAGNOSIS — Z23 Encounter for immunization: Secondary | ICD-10-CM | POA: Diagnosis not present

## 2020-12-19 DIAGNOSIS — I2510 Atherosclerotic heart disease of native coronary artery without angina pectoris: Secondary | ICD-10-CM | POA: Diagnosis not present

## 2020-12-19 DIAGNOSIS — E7800 Pure hypercholesterolemia, unspecified: Secondary | ICD-10-CM | POA: Diagnosis not present

## 2020-12-19 DIAGNOSIS — I10 Essential (primary) hypertension: Secondary | ICD-10-CM | POA: Diagnosis not present

## 2021-02-06 DIAGNOSIS — R69 Illness, unspecified: Secondary | ICD-10-CM | POA: Diagnosis not present

## 2021-02-06 DIAGNOSIS — R413 Other amnesia: Secondary | ICD-10-CM | POA: Diagnosis not present

## 2021-02-06 DIAGNOSIS — F419 Anxiety disorder, unspecified: Secondary | ICD-10-CM | POA: Diagnosis not present

## 2021-02-18 DIAGNOSIS — Z961 Presence of intraocular lens: Secondary | ICD-10-CM | POA: Diagnosis not present

## 2021-02-18 DIAGNOSIS — H524 Presbyopia: Secondary | ICD-10-CM | POA: Diagnosis not present

## 2021-02-18 DIAGNOSIS — E119 Type 2 diabetes mellitus without complications: Secondary | ICD-10-CM | POA: Diagnosis not present

## 2021-04-14 DIAGNOSIS — R413 Other amnesia: Secondary | ICD-10-CM | POA: Diagnosis not present

## 2021-04-14 DIAGNOSIS — E7800 Pure hypercholesterolemia, unspecified: Secondary | ICD-10-CM | POA: Diagnosis not present

## 2021-04-14 DIAGNOSIS — I10 Essential (primary) hypertension: Secondary | ICD-10-CM | POA: Diagnosis not present

## 2021-04-14 DIAGNOSIS — R69 Illness, unspecified: Secondary | ICD-10-CM | POA: Diagnosis not present

## 2021-04-17 ENCOUNTER — Other Ambulatory Visit (HOSPITAL_COMMUNITY): Payer: Self-pay

## 2021-04-18 ENCOUNTER — Other Ambulatory Visit (HOSPITAL_COMMUNITY): Payer: Self-pay

## 2021-04-18 MED ORDER — ESCITALOPRAM OXALATE 10 MG PO TABS
ORAL_TABLET | ORAL | 3 refills | Status: AC
  Filled 2021-04-18: qty 30, 30d supply, fill #0

## 2021-04-24 ENCOUNTER — Other Ambulatory Visit (HOSPITAL_COMMUNITY): Payer: Self-pay

## 2021-05-22 ENCOUNTER — Other Ambulatory Visit (HOSPITAL_COMMUNITY): Payer: Self-pay

## 2021-05-22 MED ORDER — ESCITALOPRAM OXALATE 10 MG PO TABS
ORAL_TABLET | ORAL | 1 refills | Status: DC
  Filled 2021-05-22: qty 30, 30d supply, fill #0

## 2021-05-23 ENCOUNTER — Other Ambulatory Visit (HOSPITAL_COMMUNITY): Payer: Self-pay

## 2021-06-09 ENCOUNTER — Other Ambulatory Visit (HOSPITAL_COMMUNITY): Payer: Self-pay

## 2021-06-09 MED ORDER — DONEPEZIL HCL 10 MG PO TABS: 10.0000 mg | ORAL_TABLET | Freq: Every evening | ORAL | 0 refills | Status: DC

## 2021-06-09 MED ORDER — DONEPEZIL HCL 10 MG PO TABS
10.0000 mg | ORAL_TABLET | Freq: Every evening | ORAL | 0 refills | Status: DC
  Filled 2021-06-09: qty 90, 90d supply, fill #0

## 2021-06-12 ENCOUNTER — Other Ambulatory Visit (HOSPITAL_COMMUNITY): Payer: Self-pay

## 2021-06-23 ENCOUNTER — Other Ambulatory Visit (HOSPITAL_COMMUNITY): Payer: Self-pay

## 2021-06-23 MED ORDER — ESCITALOPRAM OXALATE 10 MG PO TABS
ORAL_TABLET | ORAL | 1 refills | Status: DC
  Filled 2021-06-23: qty 30, 30d supply, fill #0
  Filled 2021-07-26: qty 30, 30d supply, fill #1

## 2021-06-24 ENCOUNTER — Other Ambulatory Visit (HOSPITAL_COMMUNITY): Payer: Self-pay

## 2021-07-09 ENCOUNTER — Other Ambulatory Visit (HOSPITAL_COMMUNITY): Payer: Self-pay

## 2021-07-09 MED ORDER — SIMVASTATIN 40 MG PO TABS
ORAL_TABLET | ORAL | 0 refills | Status: DC
  Filled 2021-07-09: qty 90, 90d supply, fill #0

## 2021-07-09 MED ORDER — LISINOPRIL-HYDROCHLOROTHIAZIDE 10-12.5 MG PO TABS
ORAL_TABLET | ORAL | 0 refills | Status: DC
  Filled 2021-07-09: qty 90, 90d supply, fill #0

## 2021-07-09 MED ORDER — METOPROLOL SUCCINATE ER 25 MG PO TB24
ORAL_TABLET | ORAL | 0 refills | Status: DC
  Filled 2021-07-09: qty 90, 90d supply, fill #0

## 2021-07-10 ENCOUNTER — Other Ambulatory Visit (HOSPITAL_COMMUNITY): Payer: Self-pay

## 2021-07-22 ENCOUNTER — Other Ambulatory Visit (HOSPITAL_COMMUNITY): Payer: Self-pay

## 2021-07-22 DIAGNOSIS — I10 Essential (primary) hypertension: Secondary | ICD-10-CM | POA: Diagnosis not present

## 2021-07-22 DIAGNOSIS — F419 Anxiety disorder, unspecified: Secondary | ICD-10-CM | POA: Diagnosis not present

## 2021-07-22 DIAGNOSIS — K219 Gastro-esophageal reflux disease without esophagitis: Secondary | ICD-10-CM | POA: Diagnosis not present

## 2021-07-22 DIAGNOSIS — R413 Other amnesia: Secondary | ICD-10-CM | POA: Diagnosis not present

## 2021-07-22 DIAGNOSIS — I2510 Atherosclerotic heart disease of native coronary artery without angina pectoris: Secondary | ICD-10-CM | POA: Diagnosis not present

## 2021-07-22 DIAGNOSIS — R69 Illness, unspecified: Secondary | ICD-10-CM | POA: Diagnosis not present

## 2021-07-22 DIAGNOSIS — Z1331 Encounter for screening for depression: Secondary | ICD-10-CM | POA: Diagnosis not present

## 2021-07-22 DIAGNOSIS — E7800 Pure hypercholesterolemia, unspecified: Secondary | ICD-10-CM | POA: Diagnosis not present

## 2021-07-22 DIAGNOSIS — Z0000 Encounter for general adult medical examination without abnormal findings: Secondary | ICD-10-CM | POA: Diagnosis not present

## 2021-07-22 MED ORDER — ESCITALOPRAM OXALATE 10 MG PO TABS
ORAL_TABLET | ORAL | 1 refills | Status: DC
  Filled 2021-07-22: qty 90, 90d supply, fill #0
  Filled 2021-10-19: qty 90, 90d supply, fill #1

## 2021-07-28 ENCOUNTER — Other Ambulatory Visit (HOSPITAL_COMMUNITY): Payer: Self-pay

## 2021-09-07 ENCOUNTER — Other Ambulatory Visit (HOSPITAL_COMMUNITY): Payer: Self-pay

## 2021-09-09 ENCOUNTER — Other Ambulatory Visit (HOSPITAL_COMMUNITY): Payer: Self-pay

## 2021-09-09 MED ORDER — DONEPEZIL HCL 10 MG PO TABS
10.0000 mg | ORAL_TABLET | Freq: Every day | ORAL | 1 refills | Status: DC
  Filled 2021-09-09: qty 90, 90d supply, fill #0
  Filled 2021-12-06: qty 90, 90d supply, fill #1

## 2021-10-08 ENCOUNTER — Other Ambulatory Visit (HOSPITAL_COMMUNITY): Payer: Self-pay

## 2021-10-08 MED ORDER — LISINOPRIL-HYDROCHLOROTHIAZIDE 10-12.5 MG PO TABS
1.0000 | ORAL_TABLET | Freq: Every day | ORAL | 1 refills | Status: DC
  Filled 2021-10-08: qty 9, 9d supply, fill #0
  Filled 2021-10-08: qty 81, 81d supply, fill #0
  Filled 2022-01-11: qty 90, 90d supply, fill #1

## 2021-10-08 MED ORDER — SIMVASTATIN 40 MG PO TABS
40.0000 mg | ORAL_TABLET | Freq: Every evening | ORAL | 1 refills | Status: DC
  Filled 2021-10-08: qty 90, 90d supply, fill #0
  Filled 2022-01-11: qty 90, 90d supply, fill #1

## 2021-10-08 MED ORDER — METOPROLOL SUCCINATE ER 25 MG PO TB24
25.0000 mg | ORAL_TABLET | Freq: Every day | ORAL | 1 refills | Status: DC
  Filled 2021-10-08: qty 90, 90d supply, fill #0
  Filled 2022-01-11: qty 90, 90d supply, fill #1

## 2021-10-20 ENCOUNTER — Other Ambulatory Visit (HOSPITAL_COMMUNITY): Payer: Self-pay

## 2021-10-28 ENCOUNTER — Other Ambulatory Visit (HOSPITAL_COMMUNITY): Payer: Self-pay

## 2021-12-06 ENCOUNTER — Other Ambulatory Visit (HOSPITAL_COMMUNITY): Payer: Self-pay

## 2021-12-08 ENCOUNTER — Other Ambulatory Visit (HOSPITAL_COMMUNITY): Payer: Self-pay

## 2021-12-10 ENCOUNTER — Other Ambulatory Visit (HOSPITAL_COMMUNITY): Payer: Self-pay

## 2022-01-12 ENCOUNTER — Other Ambulatory Visit (HOSPITAL_COMMUNITY): Payer: Self-pay

## 2022-01-13 DIAGNOSIS — S299XXA Unspecified injury of thorax, initial encounter: Secondary | ICD-10-CM | POA: Diagnosis not present

## 2022-01-22 DIAGNOSIS — I2510 Atherosclerotic heart disease of native coronary artery without angina pectoris: Secondary | ICD-10-CM | POA: Diagnosis not present

## 2022-01-22 DIAGNOSIS — R413 Other amnesia: Secondary | ICD-10-CM | POA: Diagnosis not present

## 2022-01-22 DIAGNOSIS — I10 Essential (primary) hypertension: Secondary | ICD-10-CM | POA: Diagnosis not present

## 2022-01-22 DIAGNOSIS — E7800 Pure hypercholesterolemia, unspecified: Secondary | ICD-10-CM | POA: Diagnosis not present

## 2022-01-22 DIAGNOSIS — K5900 Constipation, unspecified: Secondary | ICD-10-CM | POA: Diagnosis not present

## 2022-01-22 DIAGNOSIS — R251 Tremor, unspecified: Secondary | ICD-10-CM | POA: Diagnosis not present

## 2022-01-26 ENCOUNTER — Other Ambulatory Visit (HOSPITAL_COMMUNITY): Payer: Self-pay

## 2022-01-27 ENCOUNTER — Other Ambulatory Visit (HOSPITAL_COMMUNITY): Payer: Self-pay

## 2022-01-27 MED ORDER — ESCITALOPRAM OXALATE 10 MG PO TABS
10.0000 mg | ORAL_TABLET | Freq: Every day | ORAL | 0 refills | Status: DC
  Filled 2022-01-27: qty 90, 90d supply, fill #0

## 2022-02-17 ENCOUNTER — Encounter: Payer: Self-pay | Admitting: Neurology

## 2022-03-09 ENCOUNTER — Other Ambulatory Visit (HOSPITAL_COMMUNITY): Payer: Self-pay

## 2022-03-11 ENCOUNTER — Other Ambulatory Visit (HOSPITAL_COMMUNITY): Payer: Self-pay

## 2022-03-11 MED ORDER — DONEPEZIL HCL 10 MG PO TABS
10.0000 mg | ORAL_TABLET | Freq: Every day | ORAL | 1 refills | Status: DC
  Filled 2022-03-11: qty 90, 90d supply, fill #0
  Filled 2022-06-10: qty 90, 90d supply, fill #1

## 2022-04-05 NOTE — Progress Notes (Unsigned)
Assessment/Plan:   1.  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  Discussed that patient needs to be up out of the bed by 10 AM and back in the bed at night by at least 11 PM.  Patient currently is waking up at approximately 1 PM.  I suspect that this type of sleep schedule is promoting more memory change and we discussed a more physiologic sleep schedule.  Discussed that her current sleep schedule will promote confusion and hallucinations.  -Discussed that memory change prior to motor changes is no longer an exclusion criteria for the diagnosis of Parkinson's disease.  I suspect that this represents Parkinson's disease with possible Parkinson's dementia as opposed to Lewy body dementia.  Discussed with them risks of trialing levodopa and we all agree that benefit outweigh risks.  I did tell them that Parkinson's disease as well as Parkinson's medications can increase risk of hallucinations, but I think that she will probably do okay on low-dose levodopa.  We will start slowly and work up to carbidopa/levodopa 25/100, 1 tablet at 10 AM/2 PM/6 PM.  R/b/se discussed.  -We will send a referral for physical therapy at [facility].  -Because she does have significant hyperreflexia, we will do an MRI of the brain.  I did tell her that this could be physiologic hyperreflexia, but also could represent disc disease in her neck, but she really does not have significant neck pain, so we decided to hold off on MRI there.  2.  Memory change  -Patient has been on donepezil for at least 1 year.  I did not change that today.  -Discussed neurocognitive testing, but decided to hold given the lengthy wait for it.  We may reconsider that in the future, depending on response to the above.   Subjective:   Valerie Nguyen was seen today in the movement disorders clinic for neurologic consultation at the request of [name], MD.  The consultation is for the evaluation of tremor  and to r/o Parkinsons Disease.  Outside records that were made available to me were reviewed. Pt with daughter in law, Valerie Nguyen, who supplements the history.  Records indicate pt is an 82 y/o female with hx of memory change, on aricept, who has now developed tremor and gait instability.  Records indicate she was seeing a neurologist for the donepezil/memory changes but those notes aren't avail to me.  They report today that she has never seen a neurologist.     Specific Symptoms:  Tremor: Yes.  , daughter in law states that it started in the L hand but she now notes it in the R occasionally and in the legs as well.  Pt states that "I have always been a nervous person."  They shake most with use but the L may shake with rest.  She is R hand dominant Family hx of similar:  Yes.  , mother and possible maternal uncle with tremor.  Mom had leg tremor with ambulation Voice: more hoarse and weak per family Sleep: sleeps well per pt  Vivid Dreams:  Yes.    Acting out dreams:  Yes.   (They bring in video of this) Wet Pillows: Yes.   Postural symptoms:  Yes.    Falls?  Yes.   had fall in late Oct, 2023.  She was walking with something in both hands and lost balance and just fell against ottoman.  She was able to get self up and climb stairs to go to bed.  She was evaluated following [facility] at [facility].  A few weeks prior to that she took her granddaughters dog out and she was trying to pick up the poop and it was a bit hilly and she had trouble getting up from the ground.  She got help up from the family after about 5 min.  She has had some other near falls but no other falls.   Bradykinesia symptoms: shuffling gait, slow movements, and difficulty getting out of a chair Loss of smell:  Yes.   Loss of taste:  No. Urinary Incontinence:  Yes.  , wears depends (pt states that she has sensation but can't get there fast enough but fam states that at times it is because she doesn't get the sensation).   Difficulty  Swallowing:  No. Per pt but fam states that she sometimes chokes on saliva Handwriting, micrographia: Yes.   Trouble with ADL's:  some help with dressing  Trouble buttoning clothing: Yes.   Depression:  no but she admits to anxiety; she is on lexapro.husband died 13 years ago and daughter died Oct 09, 2021.   Memory changes:  Yes.  , on aricept x 1 year.  Fam states that it started about 2-2.5 years ago.  She had trouble recognizing where bathroom was in sons home and now may have trouble in own home.  After fall in oct, memory seemed to get worse.  Pt states, though that she will remember other things - she remembered where a hanger was to hang a Xmas wreath that no one could find. Hallucinations:  No. - none in the day when the sun is up; she will awaken in night or early morning and think that she sees a snake.  Her daughter in law states that she may have a "auditory hallucination in the day - she will hear a doorbell that didn't happen or a car sound when no car is there.   N/V:  No. Lightheaded:  Yes.    Syncope: No. Diplopia:  No. Dyskinesia:  No. Prior exposure to reglan/antipsychotics: No.  Neuroimaging of the brain has not previously been performed.    PREVIOUS MEDICATIONS: none to date  ALLERGIES:  No Known Allergies  CURRENT MEDICATIONS:  Current Meds  Medication Sig   aspirin 81 MG tablet Take 81 mg by mouth daily.   cholecalciferol (VITAMIN D) 1000 units tablet Take 1,000 Units by mouth daily.   donepezil (ARICEPT) 10 MG tablet Take 1 tablet (10 mg total) by mouth at bedtime.   escitalopram (LEXAPRO) 10 MG tablet Take 1 tablet by mouth once a day.   lisinopril-hydrochlorothiazide (ZESTORETIC) 10-12.5 MG tablet Take 1 tablet by mouth daily.   metoprolol tartrate (LOPRESSOR) 25 MG tablet TAKE ONE TABLET BY MOUTH ONE TIME DAILY   OVER THE COUNTER MEDICATION    simvastatin (ZOCOR) 40 MG tablet Take 1 tablet (40 mg total) by mouth at bedtime.     Objective:   VITALS:    Vitals:   04/07/22 1332  BP: 116/72  Pulse: (!) 55  SpO2: 96%  Weight: 123 lb (55.8 kg)  Height: '5\' 2"'$  (1.575 m)    GEN:  The patient appears stated age and is in NAD. HEENT:  Normocephalic, atraumatic.  The mucous membranes are moist. The superficial temporal arteries are without ropiness or tenderness. CV:  RRR Lungs:  CTAB Neck/HEME:  There are no carotid bruits bilaterally.  Neurological examination:  Orientation: The patient is alert and oriented x3.  Cranial nerves: There is  good facial symmetry. There is facial hypomimia.  Extraocular muscles are intact. The visual fields are full to confrontational testing. The speech is fluent and clear. Soft palate rises symmetrically and there is no tongue deviation. Hearing is intact to conversational tone. Sensation: Sensation is intact to light and pinprick throughout (facial, trunk, extremities). Vibration is intact at the bilateral big toe. There is no extinction with double simultaneous stimulation. There is no sensory dermatomal level identified. Motor: Strength is 5/5 in the bilateral upper and lower extremities.   Shoulder shrug is equal and symmetric.  There is no pronator drift. Deep tendon reflexes: Deep tendon reflexes are 3+/4 at the bilateral biceps, triceps, brachioradialis, patella and achilles. Plantar responses are downgoing bilaterally.  Movement examination: Tone: There is mod-severe increased tone in the LUE and mild to mod increased tone in the RUE.  There is mod increased tone in the LLE and nl in the RLE Abnormal movements: none Coordination:  There is mod decremation with RAM's, with any form of RAMS, including alternating supination and pronation of the forearm, hand opening and closing, finger taps, heel taps and toe taps, L>R Gait and Station: The patient pushes off to arise.  She is short stepped and has almost no arm swing on the L.   I have reviewed and interpreted the following labs independently   Chemistry       Component Value Date/Time   NA 138 12/21/2017 1443   K 4.0 12/21/2017 1443   CL 103 12/21/2017 1443   CO2 28 12/21/2017 1443   BUN 22 12/21/2017 1443   CREATININE 1.18 12/21/2017 1443      Component Value Date/Time   CALCIUM 10.2 12/21/2017 1443      No results found for: "TSH" Lab Results  Component Value Date   WBC 7.1 12/21/2017   HGB 13.1 12/21/2017   HCT 38.9 12/21/2017   MCV 90.2 12/21/2017   PLT 249.0 12/21/2017     Total time spent on today's visit was 65 minutes, including both face-to-face time and nonface-to-face time.  Time included that spent on review of records (prior notes available to me/labs/imaging if pertinent), discussing treatment and goals, answering patient's questions and coordinating care.  Cc:  [name], MD

## 2022-04-07 ENCOUNTER — Encounter: Payer: Self-pay | Admitting: Neurology

## 2022-04-07 ENCOUNTER — Other Ambulatory Visit (HOSPITAL_COMMUNITY): Payer: Self-pay

## 2022-04-07 ENCOUNTER — Ambulatory Visit: Payer: Medicare HMO | Admitting: Neurology

## 2022-04-07 VITALS — BP 116/72 | HR 55 | Ht 62.0 in | Wt 123.0 lb

## 2022-04-07 DIAGNOSIS — G20A1 Parkinson's disease without dyskinesia, without mention of fluctuations: Secondary | ICD-10-CM | POA: Diagnosis not present

## 2022-04-07 DIAGNOSIS — R292 Abnormal reflex: Secondary | ICD-10-CM

## 2022-04-07 MED ORDER — CARBIDOPA-LEVODOPA 25-100 MG PO TABS
1.0000 | ORAL_TABLET | Freq: Three times a day (TID) | ORAL | 1 refills | Status: DC
  Filled 2022-04-07: qty 270, 90d supply, fill #0
  Filled 2022-08-14: qty 270, 90d supply, fill #1

## 2022-04-07 NOTE — Patient Instructions (Addendum)
Start Carbidopa Levodopa as follows: Take 1/2 tablet three times daily, at least 30 minutes before meals (approximately 10am/2pm/6pm), for one week Then take 1/2 tablet in the morning, 1/2 tablet in the afternoon, 1 tablet in the evening, at least 30 minutes before meals, for one week Then take 1/2 tablet in the morning, 1 tablet in the afternoon, 1 tablet in the evening, at least 30 minutes before meals, for one week Then take 1 tablet three times daily at 10am/2pm/6pm , at least 30 minutes before meals   As a reminder, carbidopa/levodopa can be taken at the same time as a carbohydrate, but we like to have you take your pill either 30 minutes before a protein source or 1 hour after as protein can interfere with carbidopa/levodopa absorption.  A referral to [facility] has been placed for your MRI someone will contact you directly to schedule your appt. They are located at [place]. Please contact them directly by calling [phone] with any questions regarding your referral.

## 2022-04-09 ENCOUNTER — Other Ambulatory Visit (HOSPITAL_COMMUNITY): Payer: Self-pay

## 2022-04-10 ENCOUNTER — Other Ambulatory Visit (HOSPITAL_COMMUNITY): Payer: Self-pay

## 2022-04-13 ENCOUNTER — Other Ambulatory Visit (HOSPITAL_COMMUNITY): Payer: Self-pay

## 2022-04-13 MED ORDER — LISINOPRIL-HYDROCHLOROTHIAZIDE 10-12.5 MG PO TABS
1.0000 | ORAL_TABLET | Freq: Every day | ORAL | 1 refills | Status: DC
  Filled 2022-04-13: qty 90, 90d supply, fill #0

## 2022-04-13 MED ORDER — METOPROLOL SUCCINATE ER 25 MG PO TB24
25.0000 mg | ORAL_TABLET | Freq: Every day | ORAL | 1 refills | Status: DC
  Filled 2022-04-13: qty 90, 90d supply, fill #0

## 2022-04-13 MED ORDER — SIMVASTATIN 40 MG PO TABS
40.0000 mg | ORAL_TABLET | Freq: Every evening | ORAL | 1 refills | Status: DC
  Filled 2022-04-13: qty 90, 90d supply, fill #0
  Filled 2022-07-09: qty 90, 90d supply, fill #1

## 2022-04-27 ENCOUNTER — Other Ambulatory Visit (HOSPITAL_COMMUNITY): Payer: Self-pay

## 2022-04-27 MED ORDER — ESCITALOPRAM OXALATE 10 MG PO TABS
10.0000 mg | ORAL_TABLET | Freq: Every day | ORAL | 0 refills | Status: DC
  Filled 2022-04-27: qty 90, 90d supply, fill #0

## 2022-04-30 ENCOUNTER — Other Ambulatory Visit (HOSPITAL_COMMUNITY): Payer: Self-pay

## 2022-05-12 ENCOUNTER — Other Ambulatory Visit (HOSPITAL_COMMUNITY): Payer: Self-pay

## 2022-06-10 ENCOUNTER — Other Ambulatory Visit (HOSPITAL_COMMUNITY): Payer: Self-pay

## 2022-06-15 ENCOUNTER — Encounter: Payer: Self-pay | Admitting: Neurology

## 2022-07-09 ENCOUNTER — Other Ambulatory Visit (HOSPITAL_COMMUNITY): Payer: Self-pay

## 2022-07-10 ENCOUNTER — Telehealth: Payer: Self-pay | Admitting: Neurology

## 2022-07-10 ENCOUNTER — Other Ambulatory Visit: Payer: Self-pay

## 2022-07-10 NOTE — Telephone Encounter (Signed)
Pt's daughter called in having to move the pt's May appointment out to August due to [place]. She wanted to make sure it's ok to go that long? She feels the pt is doing ok on the medication and her blood pressure is ok. She doesn't feel there is a huge improvement with the medication as of now.

## 2022-07-10 NOTE — Telephone Encounter (Signed)
Called and talked to pt. Daughter and she understood Dr. Arbutus Leas answers.

## 2022-07-13 ENCOUNTER — Other Ambulatory Visit (HOSPITAL_COMMUNITY): Payer: Self-pay

## 2022-07-13 MED ORDER — ESCITALOPRAM OXALATE 10 MG PO TABS
10.0000 mg | ORAL_TABLET | Freq: Every day | ORAL | 0 refills | Status: DC
  Filled 2022-07-13: qty 90, 90d supply, fill #0

## 2022-07-14 ENCOUNTER — Other Ambulatory Visit (HOSPITAL_COMMUNITY): Payer: Self-pay

## 2022-07-27 DIAGNOSIS — I10 Essential (primary) hypertension: Secondary | ICD-10-CM | POA: Diagnosis not present

## 2022-07-27 DIAGNOSIS — I2510 Atherosclerotic heart disease of native coronary artery without angina pectoris: Secondary | ICD-10-CM | POA: Diagnosis not present

## 2022-07-27 DIAGNOSIS — Z0000 Encounter for general adult medical examination without abnormal findings: Secondary | ICD-10-CM | POA: Diagnosis not present

## 2022-07-27 DIAGNOSIS — Z9181 History of falling: Secondary | ICD-10-CM | POA: Diagnosis not present

## 2022-07-27 DIAGNOSIS — Z1331 Encounter for screening for depression: Secondary | ICD-10-CM | POA: Diagnosis not present

## 2022-07-27 DIAGNOSIS — R413 Other amnesia: Secondary | ICD-10-CM | POA: Diagnosis not present

## 2022-07-27 DIAGNOSIS — M170 Bilateral primary osteoarthritis of knee: Secondary | ICD-10-CM | POA: Diagnosis not present

## 2022-07-27 DIAGNOSIS — F419 Anxiety disorder, unspecified: Secondary | ICD-10-CM | POA: Diagnosis not present

## 2022-07-27 DIAGNOSIS — E7800 Pure hypercholesterolemia, unspecified: Secondary | ICD-10-CM | POA: Diagnosis not present

## 2022-08-06 ENCOUNTER — Ambulatory Visit: Payer: Medicare HMO | Admitting: Neurology

## 2022-08-14 ENCOUNTER — Other Ambulatory Visit (HOSPITAL_COMMUNITY): Payer: Self-pay

## 2022-09-07 ENCOUNTER — Other Ambulatory Visit (HOSPITAL_COMMUNITY): Payer: Self-pay

## 2022-09-07 MED ORDER — DONEPEZIL HCL 10 MG PO TABS
10.0000 mg | ORAL_TABLET | Freq: Every day | ORAL | 1 refills | Status: AC
  Filled 2022-09-07: qty 90, 90d supply, fill #0
  Filled 2022-12-03: qty 90, 90d supply, fill #1

## 2022-10-01 ENCOUNTER — Other Ambulatory Visit (HOSPITAL_COMMUNITY): Payer: Self-pay

## 2022-10-01 MED ORDER — ESCITALOPRAM OXALATE 10 MG PO TABS
10.0000 mg | ORAL_TABLET | Freq: Every day | ORAL | 1 refills | Status: DC
  Filled 2022-10-01: qty 90, 90d supply, fill #0
  Filled 2023-01-18 – 2023-09-08 (×2): qty 90, 90d supply, fill #1

## 2022-10-01 MED ORDER — SIMVASTATIN 40 MG PO TABS
40.0000 mg | ORAL_TABLET | Freq: Every day | ORAL | 1 refills | Status: DC
  Filled 2022-10-01: qty 90, 90d supply, fill #0
  Filled 2023-01-07: qty 90, 90d supply, fill #1

## 2022-10-27 ENCOUNTER — Ambulatory Visit: Payer: Medicare HMO | Admitting: Neurology

## 2022-11-02 DIAGNOSIS — U071 COVID-19: Secondary | ICD-10-CM | POA: Diagnosis not present

## 2022-11-02 DIAGNOSIS — R5383 Other fatigue: Secondary | ICD-10-CM | POA: Diagnosis not present

## 2022-11-09 ENCOUNTER — Ambulatory Visit: Payer: Medicare HMO | Admitting: Podiatry

## 2022-11-19 ENCOUNTER — Ambulatory Visit: Payer: Medicare HMO | Admitting: Podiatry

## 2022-12-02 NOTE — Progress Notes (Unsigned)
Assessment/Plan:   1.  Parkinsons Disease  -She looks markedly better on levodopa than she did prior to levodopa, but unfortunately she does not wake up early in the day and does not even start the medication until 12:30 PM or 1 PM.  They understand that I would like to see her up by 10 AM, but are clear that that is not going to happen.  They understand that this can cause day/night reversal and can cause confusion/hallucinations as she gets older and the disease progresses.  We discussed that if she is not going to get up until 1 PM, then she really needs to push the dosages closer together, as she is currently taking the last 1 at bedtime, rendering it useless.  She is going to start to try to take it at 3-hour intervals.  -order home PT  2.  Hyperreflexia  -We ordered an MRI of the brain but patient declined to schedule it when they called and they reiterated that decision today.  She has no significant neck pain, so we did not order an MRI of the cervical spine. Subjective:   Valerie Nguyen was seen today in follow up for Parkinsons disease, diagnosed last visit.  My previous records were reviewed prior to todays visit as well as outside records available to me.  Patient with daughter in law who supplements history.  I have not seen the patient since January.  We discussed proper sleep/wake schedule last visit and told her she needed to be out of bed by 10 AM.  We started her on levodopa and told her to take it at 10 AM/2 PM/6 PM.  Daughter in law reports that "this schedule doesn't work for her."  She is taking them at 12:30pm and 5:30pm and bedtime (9:30pm).   We ordered an MRI of the brain because of significant hyperreflexia, and she declined to schedule once the facility called.  I also sent a referral to physical therapy, but unfortunately, she did not go to that.  Her daughter in law reports she is very introverted and was not interested in going.    Current prescribed movement disorder  medications: Carbidopa/levodopa 25/100, 1 tablet at 10 AM/2 PM/6 PM    ALLERGIES:  No Known Allergies  CURRENT MEDICATIONS:  Current Meds  Medication Sig   aspirin 81 MG tablet Take 81 mg by mouth daily.   carbidopa-levodopa (SINEMET IR) 25-100 MG tablet Take 1 tablet by mouth 3 (three) times daily. 10am/2pm/6pm   cholecalciferol (VITAMIN D) 1000 units tablet Take 1,000 Units by mouth daily.   donepezil (ARICEPT) 10 MG tablet Take 1 tablet by mouth at bedtime.   escitalopram (LEXAPRO) 10 MG tablet Take 1 tablet by mouth once a day.   escitalopram (LEXAPRO) 10 MG tablet Take 1 tablet (10 mg total) by mouth daily.   OVER THE COUNTER MEDICATION    simvastatin (ZOCOR) 40 MG tablet Take 1 tablet (40 mg total) by mouth at bedtime.   simvastatin (ZOCOR) 40 MG tablet Take 1 tablet (40 mg total) by mouth at bedtime.     Objective:   PHYSICAL EXAMINATION:    VITALS:   Vitals:   12/03/22 1521  BP: 124/72  Pulse: 61  SpO2: 97%  Weight: 125 lb (56.7 kg)  Height: 5\' 2"  (1.575 m)    GEN:  The patient appears stated age and is in NAD. HEENT:  Normocephalic, atraumatic.  The mucous membranes are moist. The superficial temporal arteries are without ropiness or  tenderness. CV:  RRR Lungs:  CTAB Neck/HEME:  There are no carotid bruits bilaterally.  Neurological examination:  Orientation: The patient is alert and oriented x3.  Cranial nerves: There is good facial symmetry. There is facial hypomimia.  Extraocular muscles are intact. The visual fields are full to confrontational testing. The speech is fluent and clear. Soft palate rises symmetrically and there is no tongue deviation. Hearing is intact to conversational tone. Sensation: Sensation is intact to light touch throughout. Motor: Strength is 5/5 in the bilateral upper and lower extremities.   Shoulder shrug is equal and symmetric.  There is no pronator drift.    Movement examination: Tone: There is mild increased tone in the  L>RUE Abnormal movements: none Coordination:  There is mild to mod decremation with RAM's, with any form of RAMS, including alternating supination and pronation of the forearm, hand opening and closing, finger taps, heel taps and toe taps, L>R Gait and Station: The patient pushes off to arise.  She is given a walker.  She drags the L leg.  She has trouble approximating the chair when she gets back, but she is able to gently place herself back into the chair once she is helped in front of the chair.  I have reviewed and interpreted the following labs independently    Chemistry      Component Value Date/Time   NA 138 12/21/2017 1443   K 4.0 12/21/2017 1443   CL 103 12/21/2017 1443   CO2 28 12/21/2017 1443   BUN 22 12/21/2017 1443   CREATININE 1.18 12/21/2017 1443      Component Value Date/Time   CALCIUM 10.2 12/21/2017 1443       Lab Results  Component Value Date   WBC 7.1 12/21/2017   HGB 13.1 12/21/2017   HCT 38.9 12/21/2017   MCV 90.2 12/21/2017   PLT 249.0 12/21/2017    No results found for: "TSH"   Total time spent on today's visit was 40 minutes, including both face-to-face time and nonface-to-face time.  Time included that spent on review of records (prior notes available to me/labs/imaging if pertinent), discussing treatment and goals, answering patient's questions and coordinating care.  Cc:  Merri Brunette, MD

## 2022-12-03 ENCOUNTER — Ambulatory Visit: Payer: Medicare HMO | Admitting: Neurology

## 2022-12-03 ENCOUNTER — Other Ambulatory Visit (HOSPITAL_COMMUNITY): Payer: Self-pay

## 2022-12-03 ENCOUNTER — Encounter: Payer: Self-pay | Admitting: Neurology

## 2022-12-03 VITALS — BP 124/72 | HR 61 | Ht 62.0 in | Wt 125.0 lb

## 2022-12-03 DIAGNOSIS — G20A1 Parkinson's disease without dyskinesia, without mention of fluctuations: Secondary | ICD-10-CM

## 2022-12-03 NOTE — Patient Instructions (Signed)
SAVE THE DATE!  We are planning a Parkinsons Disease educational symposium at [facility] in [place] on October 11.  More details to come!  If you would like to be added to our email list to get further information, email [email or web address].  To sign up, you can email [email or web address].  We hope to see you there!

## 2022-12-04 ENCOUNTER — Other Ambulatory Visit: Payer: Self-pay

## 2022-12-04 DIAGNOSIS — G20A1 Parkinson's disease without dyskinesia, without mention of fluctuations: Secondary | ICD-10-CM

## 2022-12-04 DIAGNOSIS — R292 Abnormal reflex: Secondary | ICD-10-CM

## 2022-12-07 ENCOUNTER — Telehealth: Payer: Self-pay | Admitting: Neurology

## 2022-12-07 NOTE — Telephone Encounter (Signed)
Left message with after hour service on 12-07-22 at 12:32 pm   Caller states that she is calling from [facility] and needs verbal orders for a delayed start of care for tomorrow

## 2022-12-07 NOTE — Telephone Encounter (Signed)
Called and gave authorization to start tomorrow

## 2022-12-08 DIAGNOSIS — R292 Abnormal reflex: Secondary | ICD-10-CM | POA: Diagnosis not present

## 2022-12-08 DIAGNOSIS — G20C Parkinsonism, unspecified: Secondary | ICD-10-CM | POA: Diagnosis not present

## 2022-12-08 DIAGNOSIS — Z7982 Long term (current) use of aspirin: Secondary | ICD-10-CM | POA: Diagnosis not present

## 2022-12-08 DIAGNOSIS — G20A1 Parkinson's disease without dyskinesia, without mention of fluctuations: Secondary | ICD-10-CM | POA: Diagnosis not present

## 2022-12-14 ENCOUNTER — Telehealth: Payer: Self-pay | Admitting: Neurology

## 2022-12-14 NOTE — Telephone Encounter (Signed)
Home health called needing verbal orders for home PT . [phone]

## 2022-12-14 NOTE — Telephone Encounter (Signed)
Called and gave orders

## 2022-12-18 DIAGNOSIS — R292 Abnormal reflex: Secondary | ICD-10-CM | POA: Diagnosis not present

## 2022-12-18 DIAGNOSIS — Z7982 Long term (current) use of aspirin: Secondary | ICD-10-CM | POA: Diagnosis not present

## 2022-12-18 DIAGNOSIS — G20A1 Parkinson's disease without dyskinesia, without mention of fluctuations: Secondary | ICD-10-CM | POA: Diagnosis not present

## 2022-12-22 DIAGNOSIS — G20A1 Parkinson's disease without dyskinesia, without mention of fluctuations: Secondary | ICD-10-CM | POA: Diagnosis not present

## 2022-12-22 DIAGNOSIS — R292 Abnormal reflex: Secondary | ICD-10-CM | POA: Diagnosis not present

## 2022-12-22 DIAGNOSIS — Z7982 Long term (current) use of aspirin: Secondary | ICD-10-CM | POA: Diagnosis not present

## 2022-12-24 DIAGNOSIS — Z7982 Long term (current) use of aspirin: Secondary | ICD-10-CM | POA: Diagnosis not present

## 2022-12-24 DIAGNOSIS — R292 Abnormal reflex: Secondary | ICD-10-CM | POA: Diagnosis not present

## 2022-12-24 DIAGNOSIS — G20A1 Parkinson's disease without dyskinesia, without mention of fluctuations: Secondary | ICD-10-CM | POA: Diagnosis not present

## 2022-12-28 DIAGNOSIS — R292 Abnormal reflex: Secondary | ICD-10-CM | POA: Diagnosis not present

## 2022-12-28 DIAGNOSIS — G20A1 Parkinson's disease without dyskinesia, without mention of fluctuations: Secondary | ICD-10-CM | POA: Diagnosis not present

## 2022-12-28 DIAGNOSIS — Z7982 Long term (current) use of aspirin: Secondary | ICD-10-CM | POA: Diagnosis not present

## 2022-12-31 DIAGNOSIS — Z7982 Long term (current) use of aspirin: Secondary | ICD-10-CM | POA: Diagnosis not present

## 2022-12-31 DIAGNOSIS — R292 Abnormal reflex: Secondary | ICD-10-CM | POA: Diagnosis not present

## 2022-12-31 DIAGNOSIS — G20A1 Parkinson's disease without dyskinesia, without mention of fluctuations: Secondary | ICD-10-CM | POA: Diagnosis not present

## 2023-01-07 ENCOUNTER — Other Ambulatory Visit: Payer: Self-pay | Admitting: Neurology

## 2023-01-07 ENCOUNTER — Other Ambulatory Visit: Payer: Self-pay

## 2023-01-07 DIAGNOSIS — R292 Abnormal reflex: Secondary | ICD-10-CM | POA: Diagnosis not present

## 2023-01-07 DIAGNOSIS — G20A1 Parkinson's disease without dyskinesia, without mention of fluctuations: Secondary | ICD-10-CM | POA: Diagnosis not present

## 2023-01-07 DIAGNOSIS — Z7982 Long term (current) use of aspirin: Secondary | ICD-10-CM | POA: Diagnosis not present

## 2023-01-08 ENCOUNTER — Other Ambulatory Visit (HOSPITAL_COMMUNITY): Payer: Self-pay

## 2023-01-08 MED ORDER — CARBIDOPA-LEVODOPA 25-100 MG PO TABS
1.0000 | ORAL_TABLET | Freq: Three times a day (TID) | ORAL | 0 refills | Status: DC
Start: 2023-01-08 — End: 2023-07-27
  Filled 2023-01-08: qty 270, 90d supply, fill #0

## 2023-01-13 DIAGNOSIS — G20A1 Parkinson's disease without dyskinesia, without mention of fluctuations: Secondary | ICD-10-CM | POA: Diagnosis not present

## 2023-01-13 DIAGNOSIS — Z7982 Long term (current) use of aspirin: Secondary | ICD-10-CM | POA: Diagnosis not present

## 2023-01-13 DIAGNOSIS — R292 Abnormal reflex: Secondary | ICD-10-CM | POA: Diagnosis not present

## 2023-01-18 ENCOUNTER — Telehealth: Payer: Self-pay | Admitting: Neurology

## 2023-01-18 NOTE — Telephone Encounter (Signed)
Called and gave verbal orders 

## 2023-01-18 NOTE — Telephone Encounter (Signed)
[facility] called in to request OT to evaluate and treat and PT for a discharge visit after 2 weeks the week of 02/01/23-02/05/23  She is requesting a call back

## 2023-01-18 NOTE — Telephone Encounter (Signed)
Approval for OT

## 2023-01-22 DIAGNOSIS — G20A1 Parkinson's disease without dyskinesia, without mention of fluctuations: Secondary | ICD-10-CM | POA: Diagnosis not present

## 2023-01-22 DIAGNOSIS — R292 Abnormal reflex: Secondary | ICD-10-CM | POA: Diagnosis not present

## 2023-01-22 DIAGNOSIS — Z7982 Long term (current) use of aspirin: Secondary | ICD-10-CM | POA: Diagnosis not present

## 2023-01-25 ENCOUNTER — Telehealth: Payer: Self-pay | Admitting: Neurology

## 2023-01-25 NOTE — Telephone Encounter (Signed)
Marcelino Duster OT with [facility] called in she needs OT 1 time a week for 1 week and 2 times a week for 2 weeks.

## 2023-01-25 NOTE — Telephone Encounter (Signed)
Called and gave approval 

## 2023-01-26 DIAGNOSIS — R292 Abnormal reflex: Secondary | ICD-10-CM | POA: Diagnosis not present

## 2023-01-26 DIAGNOSIS — Z7982 Long term (current) use of aspirin: Secondary | ICD-10-CM | POA: Diagnosis not present

## 2023-01-26 DIAGNOSIS — G20A1 Parkinson's disease without dyskinesia, without mention of fluctuations: Secondary | ICD-10-CM | POA: Diagnosis not present

## 2023-01-27 DIAGNOSIS — G20A1 Parkinson's disease without dyskinesia, without mention of fluctuations: Secondary | ICD-10-CM | POA: Diagnosis not present

## 2023-01-27 DIAGNOSIS — Z7982 Long term (current) use of aspirin: Secondary | ICD-10-CM | POA: Diagnosis not present

## 2023-01-27 DIAGNOSIS — R292 Abnormal reflex: Secondary | ICD-10-CM | POA: Diagnosis not present

## 2023-01-28 ENCOUNTER — Other Ambulatory Visit (HOSPITAL_COMMUNITY): Payer: Self-pay

## 2023-02-02 DIAGNOSIS — R292 Abnormal reflex: Secondary | ICD-10-CM | POA: Diagnosis not present

## 2023-02-02 DIAGNOSIS — G20A1 Parkinson's disease without dyskinesia, without mention of fluctuations: Secondary | ICD-10-CM | POA: Diagnosis not present

## 2023-02-02 DIAGNOSIS — Z7982 Long term (current) use of aspirin: Secondary | ICD-10-CM | POA: Diagnosis not present

## 2023-02-04 DIAGNOSIS — G20A1 Parkinson's disease without dyskinesia, without mention of fluctuations: Secondary | ICD-10-CM | POA: Diagnosis not present

## 2023-02-04 DIAGNOSIS — R292 Abnormal reflex: Secondary | ICD-10-CM | POA: Diagnosis not present

## 2023-02-04 DIAGNOSIS — Z7982 Long term (current) use of aspirin: Secondary | ICD-10-CM | POA: Diagnosis not present

## 2023-02-05 DIAGNOSIS — Z7982 Long term (current) use of aspirin: Secondary | ICD-10-CM | POA: Diagnosis not present

## 2023-02-05 DIAGNOSIS — G20A1 Parkinson's disease without dyskinesia, without mention of fluctuations: Secondary | ICD-10-CM | POA: Diagnosis not present

## 2023-02-05 DIAGNOSIS — R292 Abnormal reflex: Secondary | ICD-10-CM | POA: Diagnosis not present

## 2023-02-09 DIAGNOSIS — G20A1 Parkinson's disease without dyskinesia, without mention of fluctuations: Secondary | ICD-10-CM | POA: Diagnosis not present

## 2023-02-09 DIAGNOSIS — Z7982 Long term (current) use of aspirin: Secondary | ICD-10-CM | POA: Diagnosis not present

## 2023-02-09 DIAGNOSIS — R292 Abnormal reflex: Secondary | ICD-10-CM | POA: Diagnosis not present

## 2023-02-11 DIAGNOSIS — G20A1 Parkinson's disease without dyskinesia, without mention of fluctuations: Secondary | ICD-10-CM | POA: Diagnosis not present

## 2023-02-11 DIAGNOSIS — R292 Abnormal reflex: Secondary | ICD-10-CM | POA: Diagnosis not present

## 2023-02-11 DIAGNOSIS — Z7982 Long term (current) use of aspirin: Secondary | ICD-10-CM | POA: Diagnosis not present

## 2023-02-15 DIAGNOSIS — Z7982 Long term (current) use of aspirin: Secondary | ICD-10-CM | POA: Diagnosis not present

## 2023-02-15 DIAGNOSIS — G20A1 Parkinson's disease without dyskinesia, without mention of fluctuations: Secondary | ICD-10-CM | POA: Diagnosis not present

## 2023-02-15 DIAGNOSIS — R292 Abnormal reflex: Secondary | ICD-10-CM | POA: Diagnosis not present

## 2023-02-16 DIAGNOSIS — R292 Abnormal reflex: Secondary | ICD-10-CM | POA: Diagnosis not present

## 2023-02-16 DIAGNOSIS — Z7982 Long term (current) use of aspirin: Secondary | ICD-10-CM | POA: Diagnosis not present

## 2023-02-16 DIAGNOSIS — G20A1 Parkinson's disease without dyskinesia, without mention of fluctuations: Secondary | ICD-10-CM | POA: Diagnosis not present

## 2023-02-22 DIAGNOSIS — G20A1 Parkinson's disease without dyskinesia, without mention of fluctuations: Secondary | ICD-10-CM | POA: Diagnosis not present

## 2023-02-22 DIAGNOSIS — R292 Abnormal reflex: Secondary | ICD-10-CM | POA: Diagnosis not present

## 2023-02-22 DIAGNOSIS — Z7982 Long term (current) use of aspirin: Secondary | ICD-10-CM | POA: Diagnosis not present

## 2023-03-01 DIAGNOSIS — K219 Gastro-esophageal reflux disease without esophagitis: Secondary | ICD-10-CM | POA: Diagnosis not present

## 2023-03-01 DIAGNOSIS — Z23 Encounter for immunization: Secondary | ICD-10-CM | POA: Diagnosis not present

## 2023-03-01 DIAGNOSIS — I2510 Atherosclerotic heart disease of native coronary artery without angina pectoris: Secondary | ICD-10-CM | POA: Diagnosis not present

## 2023-03-01 DIAGNOSIS — F0280 Dementia in other diseases classified elsewhere without behavioral disturbance: Secondary | ICD-10-CM | POA: Diagnosis not present

## 2023-03-01 DIAGNOSIS — E7800 Pure hypercholesterolemia, unspecified: Secondary | ICD-10-CM | POA: Diagnosis not present

## 2023-03-01 DIAGNOSIS — G20A1 Parkinson's disease without dyskinesia, without mention of fluctuations: Secondary | ICD-10-CM | POA: Diagnosis not present

## 2023-03-01 DIAGNOSIS — F419 Anxiety disorder, unspecified: Secondary | ICD-10-CM | POA: Diagnosis not present

## 2023-03-01 DIAGNOSIS — I10 Essential (primary) hypertension: Secondary | ICD-10-CM | POA: Diagnosis not present

## 2023-03-01 DIAGNOSIS — M170 Bilateral primary osteoarthritis of knee: Secondary | ICD-10-CM | POA: Diagnosis not present

## 2023-03-02 DIAGNOSIS — R292 Abnormal reflex: Secondary | ICD-10-CM | POA: Diagnosis not present

## 2023-03-02 DIAGNOSIS — Z7982 Long term (current) use of aspirin: Secondary | ICD-10-CM | POA: Diagnosis not present

## 2023-03-02 DIAGNOSIS — G20A1 Parkinson's disease without dyskinesia, without mention of fluctuations: Secondary | ICD-10-CM | POA: Diagnosis not present

## 2023-03-04 ENCOUNTER — Other Ambulatory Visit (HOSPITAL_COMMUNITY): Payer: Self-pay

## 2023-03-04 MED ORDER — DONEPEZIL HCL 10 MG PO TABS
10.0000 mg | ORAL_TABLET | Freq: Every day | ORAL | 1 refills | Status: DC
  Filled 2023-03-04: qty 90, 90d supply, fill #0
  Filled 2023-06-02: qty 90, 90d supply, fill #1

## 2023-03-04 MED ORDER — ESCITALOPRAM OXALATE 10 MG PO TABS
10.0000 mg | ORAL_TABLET | Freq: Every day | ORAL | 1 refills | Status: AC
  Filled 2023-03-04: qty 90, 90d supply, fill #0
  Filled 2023-06-09: qty 90, 90d supply, fill #1

## 2023-03-06 ENCOUNTER — Other Ambulatory Visit (HOSPITAL_COMMUNITY): Payer: Self-pay

## 2023-04-08 ENCOUNTER — Other Ambulatory Visit (HOSPITAL_COMMUNITY): Payer: Self-pay

## 2023-04-09 ENCOUNTER — Other Ambulatory Visit (HOSPITAL_COMMUNITY): Payer: Self-pay

## 2023-04-09 MED ORDER — SIMVASTATIN 40 MG PO TABS
40.0000 mg | ORAL_TABLET | Freq: Every day | ORAL | 1 refills | Status: DC
  Filled 2023-04-09: qty 90, 90d supply, fill #0
  Filled 2023-07-09: qty 90, 90d supply, fill #1

## 2023-04-12 ENCOUNTER — Other Ambulatory Visit (HOSPITAL_COMMUNITY): Payer: Self-pay

## 2023-05-31 NOTE — Progress Notes (Deleted)
 Assessment/Plan:   1.  Parkinsons Disease  -She looks markedly better on levodopa than she did prior to levodopa, but unfortunately she does not wake up early in the day and does not even start the medication until 12:30 PM or 1 PM.  They understand that I would like to see her up by 10 AM, but are clear that that is not going to happen.  They understand that this can cause day/night reversal and can cause confusion/hallucinations as she gets older and the disease progresses.  We discussed that if she is not going to get up until 1 PM, then she really needs to push the dosages closer together, as she is currently taking the last 1 at bedtime, rendering it useless.  She is going to start to try to take it at 3-hour intervals.  -order home PT  2.  Hyperreflexia  -We ordered an MRI of the brain but patient declined to schedule it when they called and they reiterated that decision previously.  She has no significant neck pain, so we did not order an MRI of the cervical spine. Subjective:   Valerie Nguyen was seen today in follow up for Parkinsons disease, diagnosed last visit.  My previous records were reviewed prior to todays visit as well as outside records available to me.  Patient with daughter in law who supplements history. ***She has had no falls since last visit.  No lightheadedness or near syncope.  Current prescribed movement disorder medications: Carbidopa/levodopa 25/100, 1 tablet at 10 AM/2 PM/6 PM    ALLERGIES:  No Known Allergies  CURRENT MEDICATIONS:  No outpatient medications have been marked as taking for the 06/01/23 encounter (Appointment) with Jaxon Mynhier, Octaviano Batty, DO.     Objective:   PHYSICAL EXAMINATION:    VITALS:   There were no vitals filed for this visit.   GEN:  The patient appears stated age and is in NAD. HEENT:  Normocephalic, atraumatic.  The mucous membranes are moist. The superficial temporal arteries are without ropiness or tenderness. CV:  RRR Lungs:   CTAB Neck/HEME:  There are no carotid bruits bilaterally.  Neurological examination:  Orientation: The patient is alert and oriented x3.  Cranial nerves: There is good facial symmetry. There is facial hypomimia.  Extraocular muscles are intact. The visual fields are full to confrontational testing. The speech is fluent and clear. Soft palate rises symmetrically and there is no tongue deviation. Hearing is intact to conversational tone. Sensation: Sensation is intact to light touch throughout. Motor: Strength is 5/5 in the bilateral upper and lower extremities.   Shoulder shrug is equal and symmetric.  There is no pronator drift.    Movement examination: Tone: There is mild increased tone in the L>RUE Abnormal movements: none Coordination:  There is mild to mod decremation with RAM's, with any form of RAMS, including alternating supination and pronation of the forearm, hand opening and closing, finger taps, heel taps and toe taps, L>R Gait and Station: The patient pushes off to arise.  She is given a walker.  She drags the L leg.  She has trouble approximating the chair when she gets back, but she is able to gently place herself back into the chair once she is helped in front of the chair.  I have reviewed and interpreted the following labs independently    Chemistry      Component Value Date/Time   NA 138 12/21/2017 1443   K 4.0 12/21/2017 1443   CL 103  12/21/2017 1443   CO2 28 12/21/2017 1443   BUN 22 12/21/2017 1443   CREATININE 1.18 12/21/2017 1443      Component Value Date/Time   CALCIUM 10.2 12/21/2017 1443       Lab Results  Component Value Date   WBC 7.1 12/21/2017   HGB 13.1 12/21/2017   HCT 38.9 12/21/2017   MCV 90.2 12/21/2017   PLT 249.0 12/21/2017    No results found for: "TSH"   Total time spent on today's visit was *** minutes, including both face-to-face time and nonface-to-face time.  Time included that spent on review of records (prior notes available  to me/labs/imaging if pertinent), discussing treatment and goals, answering patient's questions and coordinating care.  Cc:  Merri Brunette, MD

## 2023-06-01 ENCOUNTER — Ambulatory Visit: Payer: Medicare HMO | Admitting: Neurology

## 2023-07-26 ENCOUNTER — Ambulatory Visit: Admitting: Neurology

## 2023-07-26 NOTE — Progress Notes (Deleted)
 Assessment/Plan:   1.  Parkinsons Disease  -She looks markedly better on levodopa  than she did prior to levodopa , but unfortunately she does not wake up early in the day and does not even start the medication until 12:30 PM or 1 PM.  They understand that I would like to see her up by 10 AM, but are clear that that is not going to happen.  They understand that this can cause day/night reversal and can cause confusion/hallucinations as she gets older and the disease progresses.  We discussed that if she is not going to get up until 1 PM, then she really needs to push the dosages closer together, as she is currently taking the last 1 at bedtime, rendering it useless.  She is going to start to try to take it at 3-hour intervals.  2.  Hyperreflexia  -We ordered an MRI of the brain but patient declined to schedule it when they called and they reiterated that decision today.  She has no significant neck pain, so we did not order an MRI of the cervical spine. Subjective:   Valerie Nguyen was seen today in follow up for Parkinsons disease, diagnosed last visit.  My previous records were reviewed prior to todays visit as well as outside records available to me.  Patient with daughter in law who supplements history.  Patient has had no falls since last visit.  She did do home physical therapy after our last visit and did well with that.  No lightheadedness or near syncope.  Current prescribed movement disorder medications: Carbidopa /levodopa  25/100, 1 tablet at 10 AM/2 PM/6 PM    ALLERGIES:  No Known Allergies  CURRENT MEDICATIONS:  No outpatient medications have been marked as taking for the 07/26/23 encounter (Appointment) with Casha Estupinan, Von Grumbling, DO.     Objective:   PHYSICAL EXAMINATION:    VITALS:   There were no vitals filed for this visit.   GEN:  The patient appears stated age and is in NAD. HEENT:  Normocephalic, atraumatic.  The mucous membranes are moist. The superficial temporal  arteries are without ropiness or tenderness. CV:  RRR Lungs:  CTAB Neck/HEME:  There are no carotid bruits bilaterally.  Neurological examination:  Orientation: The patient is alert and oriented x3.  Cranial nerves: There is good facial symmetry. There is facial hypomimia.  Extraocular muscles are intact. The visual fields are full to confrontational testing. The speech is fluent and clear. Soft palate rises symmetrically and there is no tongue deviation. Hearing is intact to conversational tone. Sensation: Sensation is intact to light touch throughout. Motor: Strength is 5/5 in the bilateral upper and lower extremities.   Shoulder shrug is equal and symmetric.  There is no pronator drift.    Movement examination: Tone: There is mild increased tone in the L>RUE Abnormal movements: none Coordination:  There is mild to mod decremation with RAM's, with any form of RAMS, including alternating supination and pronation of the forearm, hand opening and closing, finger taps, heel taps and toe taps, L>R Gait and Station: The patient pushes off to arise.  She is given a walker.  She drags the L leg.  She has trouble approximating the chair when she gets back, but she is able to gently place herself back into the chair once she is helped in front of the chair.  I have reviewed and interpreted the following labs independently    Chemistry      Component Value Date/Time   NA  138 12/21/2017 1443   K 4.0 12/21/2017 1443   CL 103 12/21/2017 1443   CO2 28 12/21/2017 1443   BUN 22 12/21/2017 1443   CREATININE 1.18 12/21/2017 1443      Component Value Date/Time   CALCIUM 10.2 12/21/2017 1443       Lab Results  Component Value Date   WBC 7.1 12/21/2017   HGB 13.1 12/21/2017   HCT 38.9 12/21/2017   MCV 90.2 12/21/2017   PLT 249.0 12/21/2017    No results found for: "TSH"   Total time spent on today's visit was *** minutes, including both face-to-face time and nonface-to-face time.  Time  included that spent on review of records (prior notes available to me/labs/imaging if pertinent), discussing treatment and goals, answering patient's questions and coordinating care.  Cc:  Faustina Hood, MD

## 2023-07-27 ENCOUNTER — Other Ambulatory Visit: Payer: Self-pay | Admitting: Neurology

## 2023-07-27 DIAGNOSIS — G20A1 Parkinson's disease without dyskinesia, without mention of fluctuations: Secondary | ICD-10-CM

## 2023-07-29 ENCOUNTER — Other Ambulatory Visit (HOSPITAL_COMMUNITY): Payer: Self-pay

## 2023-07-30 NOTE — Telephone Encounter (Signed)
 Dr. Winferd Hatter patient is scheduled do you give approval to fill patients meds until Sept?

## 2023-07-30 NOTE — Telephone Encounter (Signed)
 Patient has a no show and two cancelled appointments. She will need to be scheduled and only receive enough meds to get to appointment and if that appointment is missed no refills. She would have to reach out to PCP to receive any medication after any more missed appointments

## 2023-07-30 NOTE — Telephone Encounter (Signed)
 Pt is sch for 11-25-23 at 3:00 with Tat  please call Sherline Distel to let her know if we will be able to call in the medication

## 2023-08-02 ENCOUNTER — Other Ambulatory Visit (HOSPITAL_COMMUNITY): Payer: Self-pay

## 2023-08-02 DIAGNOSIS — Z0000 Encounter for general adult medical examination without abnormal findings: Secondary | ICD-10-CM | POA: Diagnosis not present

## 2023-08-02 DIAGNOSIS — G20B2 Parkinson's disease with dyskinesia, with fluctuations: Secondary | ICD-10-CM | POA: Diagnosis not present

## 2023-08-02 DIAGNOSIS — F0280 Dementia in other diseases classified elsewhere without behavioral disturbance: Secondary | ICD-10-CM | POA: Diagnosis not present

## 2023-08-02 DIAGNOSIS — I2510 Atherosclerotic heart disease of native coronary artery without angina pectoris: Secondary | ICD-10-CM | POA: Diagnosis not present

## 2023-08-02 DIAGNOSIS — E7800 Pure hypercholesterolemia, unspecified: Secondary | ICD-10-CM | POA: Diagnosis not present

## 2023-08-02 DIAGNOSIS — I10 Essential (primary) hypertension: Secondary | ICD-10-CM | POA: Diagnosis not present

## 2023-08-02 DIAGNOSIS — Z1331 Encounter for screening for depression: Secondary | ICD-10-CM | POA: Diagnosis not present

## 2023-08-02 MED ORDER — CARBIDOPA-LEVODOPA 25-100 MG PO TABS
1.0000 | ORAL_TABLET | Freq: Three times a day (TID) | ORAL | 0 refills | Status: DC
  Filled 2023-08-02: qty 90, 30d supply, fill #0

## 2023-08-23 ENCOUNTER — Other Ambulatory Visit (HOSPITAL_COMMUNITY): Payer: Self-pay

## 2023-08-23 MED ORDER — DONEPEZIL HCL 10 MG PO TABS
10.0000 mg | ORAL_TABLET | Freq: Every day | ORAL | 1 refills | Status: DC
  Filled 2023-08-23: qty 90, 90d supply, fill #0
  Filled 2023-11-30: qty 90, 90d supply, fill #1

## 2023-09-01 ENCOUNTER — Other Ambulatory Visit (HOSPITAL_COMMUNITY): Payer: Self-pay

## 2023-09-01 ENCOUNTER — Other Ambulatory Visit: Payer: Self-pay | Admitting: Neurology

## 2023-09-01 DIAGNOSIS — G20A1 Parkinson's disease without dyskinesia, without mention of fluctuations: Secondary | ICD-10-CM

## 2023-09-01 MED ORDER — CARBIDOPA-LEVODOPA 25-100 MG PO TABS
1.0000 | ORAL_TABLET | Freq: Three times a day (TID) | ORAL | 0 refills | Status: DC
  Filled 2023-09-01: qty 180, 60d supply, fill #0

## 2023-09-08 ENCOUNTER — Other Ambulatory Visit (HOSPITAL_COMMUNITY): Payer: Self-pay

## 2023-09-20 ENCOUNTER — Emergency Department (HOSPITAL_BASED_OUTPATIENT_CLINIC_OR_DEPARTMENT_OTHER)
Admission: EM | Admit: 2023-09-20 | Discharge: 2023-09-20 | Disposition: A | Discharge status: Home / Self Care | Source: Ambulatory Visit | Attending: Emergency Medicine | Admitting: Emergency Medicine

## 2023-09-20 ENCOUNTER — Ambulatory Visit: Admitting: Podiatry

## 2023-09-20 DIAGNOSIS — R410 Disorientation, unspecified: Secondary | ICD-10-CM | POA: Diagnosis not present

## 2023-09-20 DIAGNOSIS — R197 Diarrhea, unspecified: Secondary | ICD-10-CM | POA: Insufficient documentation

## 2023-09-20 DIAGNOSIS — Z7982 Long term (current) use of aspirin: Secondary | ICD-10-CM | POA: Diagnosis not present

## 2023-09-20 DIAGNOSIS — R630 Anorexia: Secondary | ICD-10-CM | POA: Diagnosis not present

## 2023-09-20 DIAGNOSIS — N390 Urinary tract infection, site not specified: Secondary | ICD-10-CM | POA: Insufficient documentation

## 2023-09-20 DIAGNOSIS — B9689 Other specified bacterial agents as the cause of diseases classified elsewhere: Secondary | ICD-10-CM | POA: Insufficient documentation

## 2023-09-20 DIAGNOSIS — F0390 Unspecified dementia without behavioral disturbance: Secondary | ICD-10-CM | POA: Diagnosis not present

## 2023-09-20 DIAGNOSIS — R531 Weakness: Secondary | ICD-10-CM | POA: Diagnosis not present

## 2023-09-20 LAB — CBC
HCT: 37.1 % (ref 36.0–46.0)
Hemoglobin: 12.1 g/dL (ref 12.0–15.0)
MCH: 30.6 pg (ref 26.0–34.0)
MCHC: 32.6 g/dL (ref 30.0–36.0)
MCV: 93.7 fL (ref 80.0–100.0)
Platelets: 220 10*3/uL (ref 150–400)
RBC: 3.96 MIL/uL (ref 3.87–5.11)
RDW: 12.8 % (ref 11.5–15.5)
WBC: 6.5 10*3/uL (ref 4.0–10.5)
nRBC: 0 % (ref 0.0–0.2)

## 2023-09-20 LAB — URINALYSIS, ROUTINE W REFLEX MICROSCOPIC
Bilirubin Urine: NEGATIVE
Glucose, UA: NEGATIVE mg/dL
Ketones, ur: 15 mg/dL — AB
Nitrite: POSITIVE — AB
Protein, ur: 30 mg/dL — AB
Specific Gravity, Urine: 1.029 (ref 1.005–1.030)
pH: 5.5 (ref 5.0–8.0)

## 2023-09-20 LAB — COMPREHENSIVE METABOLIC PANEL WITH GFR
ALT: <5 U/L (ref 0–44)
AST: 13 U/L — ABNORMAL LOW (ref 15–41)
Albumin: 3.9 g/dL (ref 3.5–5.0)
Alkaline Phosphatase: 64 U/L (ref 38–126)
Anion gap: 13 (ref 5–15)
BUN: 19 mg/dL (ref 8–23)
CO2: 22 mmol/L (ref 22–32)
Calcium: 10 mg/dL (ref 8.9–10.3)
Chloride: 107 mmol/L (ref 98–111)
Creatinine, Ser: 0.92 mg/dL (ref 0.44–1.00)
GFR, Estimated: >60 mL/min (ref >60)
Glucose, Bld: 129 mg/dL — ABNORMAL HIGH (ref 70–99)
Potassium: 3.4 mmol/L — ABNORMAL LOW (ref 3.5–5.1)
Sodium: 142 mmol/L (ref 135–145)
Total Bilirubin: 0.4 mg/dL (ref 0.0–1.2)
Total Protein: 6.6 g/dL (ref 6.5–8.1)

## 2023-09-20 LAB — LIPASE, BLOOD: Lipase: 19 U/L (ref 11–51)

## 2023-09-20 MED ORDER — CEPHALEXIN 500 MG PO CAPS
500.0000 mg | ORAL_CAPSULE | Freq: Three times a day (TID) | ORAL | 0 refills | Status: AC
  Filled 2023-09-20: qty 21, 7d supply, fill #0

## 2023-09-20 MED ORDER — CEPHALEXIN 250 MG PO CAPS
500.0000 mg | ORAL_CAPSULE | Freq: Once | ORAL | Status: AC
  Administered 2023-09-20: 500 mg via ORAL
  Filled 2023-09-20: qty 2

## 2023-09-20 MED ORDER — SODIUM CHLORIDE 0.9 % IV BOLUS
1000.0000 mL | Freq: Once | INTRAVENOUS | Status: AC
  Administered 2023-09-20: 1000 mL via INTRAVENOUS

## 2023-09-20 NOTE — ED Provider Notes (Signed)
 [place] EMERGENCY DEPARTMENT AT [facility] Provider Note   CSN: [id number] Arrival date & time: 09/20/23  1725     Patient presents with: Diarrhea   Valerie Nguyen is a 83 y.o. female.  She has a history of dementia, level 5 caveat.  Son is giving primary history.  She has had 2 weeks of diarrhea, twice a day.  Nonbloody.  No abdominal pain or fever.  Normally she is fairly constipated.  No recent travel or sick contacts.  They have been in contact with PCP and have her on a bland diet and Imodium without any improvement.  Has been eating and drinking less.  Getting weaker.  {Add pertinent medical, surgical, social history, OB history to YEP:67052} The history is provided by the patient and a relative.  Diarrhea Quality:  Watery Severity:  Moderate Onset quality:  Gradual Duration:  2 weeks Timing:  Intermittent Progression:  Unchanged Relieved by:  Nothing Ineffective treatments:  Anti-motility medications Associated symptoms: no abdominal pain, no chills, no recent cough, no fever and no vomiting   Risk factors: no recent antibiotic use, no sick contacts, no suspicious food intake and no travel to endemic areas        Prior to Admission medications   Medication Sig Start Date End Date Taking? Authorizing Provider  aspirin 81 MG tablet Take 81 mg by mouth daily.    [provider]  carbidopa -levodopa  (SINEMET  IR) 25-100 MG tablet Take 1 tablet by mouth 3 (three) times daily. 10am/2pm/6pm 09/01/23   Tat, Asberry RAMAN, DO  cholecalciferol (VITAMIN D) 1000 units tablet Take 1,000 Units by mouth daily.    [provider]  donepezil  (ARICEPT ) 10 MG tablet Take 1 tablet by mouth at bedtime. 09/07/22     donepezil  (ARICEPT ) 10 MG tablet Take 1 tablet (10 mg total) by mouth daily. 08/23/23     escitalopram  (LEXAPRO ) 10 MG tablet Take 1 tablet by mouth once a day. 04/18/21     escitalopram  (LEXAPRO ) 10 MG tablet Take 1 tablet (10 mg total) by mouth daily. 10/01/22      escitalopram  (LEXAPRO ) 10 MG tablet Take 1 tablet (10 mg total) by mouth daily. 03/04/23     lisinopril -hydrochlorothiazide  (ZESTORETIC ) 10-12.5 MG tablet Take 1 tablet by mouth daily. Patient not taking: Reported on 12/03/2022 03/07/19   Luke Lacks R, DO  lisinopril -hydrochlorothiazide  (ZESTORETIC ) 10-12.5 MG tablet TAKE 1 TABLET BY MOUTH ONCE DAILY Patient not taking: Reported on 12/03/2022 04/13/22     metoprolol  succinate (TOPROL -XL) 25 MG 24 hr tablet TAKE 1 TABLET BY MOUTH ONCE DAILY Patient not taking: Reported on 12/03/2022 04/13/22     metoprolol  tartrate (LOPRESSOR ) 25 MG tablet TAKE ONE TABLET BY MOUTH ONE TIME DAILY Patient not taking: Reported on 12/03/2022 03/07/19   Luke Lacks SAUNDERS, DO  OVER THE COUNTER MEDICATION     [provider]  simvastatin  (ZOCOR ) 40 MG tablet Take 1 tablet (40 mg total) by mouth at bedtime. 03/07/19   Luke Lacks SAUNDERS, DO  simvastatin  (ZOCOR ) 40 MG tablet Take 1 tablet (40 mg total) by mouth at bedtime. 04/08/23       Allergies: Patient has no known allergies.    Review of Systems  Constitutional:  Negative for chills and fever.  Gastrointestinal:  Positive for diarrhea. Negative for abdominal pain and vomiting.    Updated Vital Signs BP (!) 126/56 (BP Location: Left Arm)   Pulse 66   Temp 98.4 F (36.9 C)   Resp 14  SpO2 94%   Physical Exam Vitals and nursing note reviewed.  Constitutional:      General: She is not in acute distress.    Appearance: Normal appearance. She is well-developed.  HENT:     Head: Normocephalic and atraumatic.   Eyes:     Conjunctiva/sclera: Conjunctivae normal.    Cardiovascular:     Rate and Rhythm: Normal rate and regular rhythm.     Heart sounds: No murmur heard. Pulmonary:     Effort: Pulmonary effort is normal. No respiratory distress.     Breath sounds: Normal breath sounds. No stridor. No wheezing.  Abdominal:     Palpations: Abdomen is soft.     Tenderness: There is no abdominal tenderness.  There is no guarding or rebound.   Musculoskeletal:        General: No tenderness or deformity. Normal range of motion.     Cervical back: Neck supple.   Skin:    General: Skin is warm and dry.   Neurological:     General: No focal deficit present.     Mental Status: She is alert. She is disoriented.     GCS: GCS eye subscore is 4. GCS verbal subscore is 5. GCS motor subscore is 6.     (all labs ordered are listed, but only abnormal results are displayed) Labs Reviewed  COMPREHENSIVE METABOLIC PANEL WITH GFR - Abnormal; Notable for the following components:      Result Value   Potassium 3.4 (*)    Glucose, Bld 129 (*)    AST 13 (*)    All other components within normal limits  GASTROINTESTINAL PANEL BY PCR, STOOL (REPLACES STOOL CULTURE)  C DIFFICILE QUICK SCREEN W PCR REFLEX    LIPASE, BLOOD  CBC  URINALYSIS, ROUTINE W REFLEX MICROSCOPIC    EKG: None  Radiology: No results found.  {Document cardiac monitor, telemetry assessment procedure when appropriate:32947} Procedures   Medications Ordered in the ED  sodium chloride  0.9 % bolus 1,000 mL (has no administration in time range)      {Click here for ABCD2, HEART and other calculators REFRESH Note before signing:1}                              Medical Decision Making Amount and/or Complexity of Data Reviewed Labs: ordered.   This patient complains of ***; this involves an extensive number of treatment Options and is a complaint that carries with it a high risk of complications and morbidity. The differential includes ***  I ordered, reviewed and interpreted labs, which included *** I ordered medication *** and reviewed PMP when indicated. I ordered imaging studies which included *** and I independently    visualized and interpreted imaging which showed *** Additional history obtained from *** Previous records obtained and reviewed *** I consulted *** and discussed lab and imaging findings and discussed  disposition.  Cardiac monitoring reviewed, *** Social determinants considered, *** Critical Interventions: ***  After the interventions stated above, I reevaluated the patient and found *** Admission and further testing considered, ***   {Document critical care time when appropriate  Document review of labs and clinical decision tools ie CHADS2VASC2, etc  Document your independent review of radiology images and any outside records  Document your discussion with family members, caretakers and with consultants  Document social determinants of health affecting pt's care  Document your decision making why or why not admission, treatments were needed:32947:::1}  Final diagnoses:  None    ED Discharge Orders     None

## 2023-09-20 NOTE — ED Notes (Signed)
 PT aware of the need for a urine... Pt unable to currently provide a sample.SABRASABRA

## 2023-09-20 NOTE — Discharge Instructions (Signed)
 Your workup showed a probable urinary tract infection and we are starting you on antibiotics.  Your lab work was fairly unremarkable otherwise.  Unfortunately you were unable to give us  a stool sample to send to the lab.  Please review with your primary care doctor to see if they can arrange you to get a sample to the lab.  Return to the emergency department if any high fevers or worsening symptoms.

## 2023-09-20 NOTE — ED Notes (Signed)
 Enteric Precaution sign placed on door as well as red cap on Purell dispenser.

## 2023-09-20 NOTE — ED Notes (Addendum)
 PT attempting to collect specimen with the patient. Supplies provided and toilet liner placed for collection

## 2023-09-20 NOTE — ED Triage Notes (Signed)
 Per family patient has had diarrhea for the past 2 weeks. Tried bland diet and imodium without relief.

## 2023-09-21 ENCOUNTER — Other Ambulatory Visit (HOSPITAL_COMMUNITY): Payer: Self-pay

## 2023-09-23 LAB — URINE CULTURE: Culture: >=100000 — AB

## 2023-09-24 ENCOUNTER — Telehealth (HOSPITAL_BASED_OUTPATIENT_CLINIC_OR_DEPARTMENT_OTHER): Payer: Self-pay | Admitting: *Deleted

## 2023-09-24 NOTE — Telephone Encounter (Signed)
 Post ED Visit - Positive Culture Follow-up  Culture report reviewed by antimicrobial stewardship pharmacist: [facility] [x]  Luke Sour, Pharm.D. []  Venetia Gully, Pharm.D., BCPS AQ-ID []  Garrel Crews, Pharm.D., BCPS []  Almarie Lunger, Pharm.D., BCPS []  Elizabeth, [address].D., BCPS, AAHIVP []  Rosaline Bihari, Pharm.D., BCPS, AAHIVP []  Vernell Meier, PharmD, BCPS []  Latanya Hint, PharmD, BCPS []  Donald Medley, PharmD, BCPS []  Rocky Bold, PharmD []  Dorothyann Alert, PharmD, BCPS []  Morene Babe, PharmD  [facility] []  Rosaline Edison, PharmD []  Romona Bliss, PharmD []  Dolphus Roller, PharmD []  Veva Seip, Rph []  Vernell Daunt) Leonce, PharmD []  Eva Allis, PharmD []  Rosaline Millet, PharmD []  Iantha Batch, PharmD []  Arvin Gauss, PharmD []  Wanda Hasting, PharmD []  Ronal Rav, PharmD []  Rocky Slade, PharmD []  Bard Jeans, PharmD   Positive urine culture Treated with cephalexin , organism sensitive to the same and no further patient follow-up is required at this time.  Lorita Barnie Pereyra 09/24/2023, 9:25 AM

## 2023-09-28 ENCOUNTER — Other Ambulatory Visit (HOSPITAL_COMMUNITY): Payer: Self-pay

## 2023-09-28 MED ORDER — SIMVASTATIN 40 MG PO TABS
40.0000 mg | ORAL_TABLET | Freq: Every day | ORAL | 1 refills | Status: DC
  Filled 2023-09-28: qty 90, 90d supply, fill #0
  Filled 2023-12-29: qty 90, 90d supply, fill #1

## 2023-09-30 ENCOUNTER — Ambulatory Visit: Admitting: Podiatry

## 2023-10-01 DIAGNOSIS — R197 Diarrhea, unspecified: Secondary | ICD-10-CM | POA: Diagnosis not present

## 2023-10-04 ENCOUNTER — Other Ambulatory Visit (HOSPITAL_COMMUNITY): Payer: Self-pay

## 2023-10-07 ENCOUNTER — Ambulatory Visit (INDEPENDENT_AMBULATORY_CARE_PROVIDER_SITE_OTHER)

## 2023-10-07 ENCOUNTER — Ambulatory Visit (INDEPENDENT_AMBULATORY_CARE_PROVIDER_SITE_OTHER): Admitting: Podiatry

## 2023-10-07 ENCOUNTER — Encounter: Payer: Self-pay | Admitting: Podiatry

## 2023-10-07 DIAGNOSIS — M778 Other enthesopathies, not elsewhere classified: Secondary | ICD-10-CM | POA: Diagnosis not present

## 2023-10-07 DIAGNOSIS — M7661 Achilles tendinitis, right leg: Secondary | ICD-10-CM | POA: Diagnosis not present

## 2023-10-07 DIAGNOSIS — M79675 Pain in left toe(s): Secondary | ICD-10-CM

## 2023-10-07 DIAGNOSIS — M79674 Pain in right toe(s): Secondary | ICD-10-CM | POA: Diagnosis not present

## 2023-10-07 DIAGNOSIS — M7662 Achilles tendinitis, left leg: Secondary | ICD-10-CM

## 2023-10-07 DIAGNOSIS — B351 Tinea unguium: Secondary | ICD-10-CM | POA: Diagnosis not present

## 2023-10-08 NOTE — Progress Notes (Signed)
 Subjective:   Patient ID: Valerie Nguyen, female   DOB: 83 y.o.   MRN: [id number]   HPI Patient presents with caregiver with severe nail disease 1-5 both feet that have not been cut for several years and irritation of the posterior plantar heel region bilateral worse when she tries to sleep or lay her feet down.  She is in relatively poor health does not smoke is not active   Review of Systems  All other systems reviewed and are negative.       Objective:  Physical Exam Vitals and nursing note reviewed.  Constitutional:      Appearance: She is well-developed.  Pulmonary:     Effort: Pulmonary effort is normal.  Musculoskeletal:        General: Normal range of motion.  Skin:    General: Skin is warm.  Neurological:     Mental Status: She is alert.     Neurovascular status was found to be intact muscle strength reduced range of motion reduced with the patient having thin skin and irritation of the plantar posterior calcaneus with no breakdown of tissue.  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  Discussed the Achilles tendon and recommended pillows to try to offload weight from there and also small pillow she could wear it to be strapped to her feet which they are going to purchase.  Patient will be seen back as needed and caregiver and all questions answered  X-rays indicate that there is minimal spur formation no indications of stress fracture or arthritis associated with the pain

## 2023-11-24 NOTE — Progress Notes (Unsigned)
 Assessment/Plan:   1.  Parkinsons Disease             -She looks markedly better on levodopa  than she did prior to levodopa , but unfortunately she does not wake up early in the day and does not even start the medication until 12:30 PM or 1 PM.  They understand that I would like to see her up by 10 AM, but are clear that that is not going to happen.  They understand that this can cause day/night reversal and can cause confusion/hallucinations as she gets older and the disease progresses.  We discussed that if she is not going to get up until 1 PM, then she really needs to push the dosages closer together, as she is currently taking the last 1 at bedtime, rendering it useless.  She is going to start to try to take it at 3-hour intervals.             -order home PT   2.  Hyperreflexia             -We ordered an MRI of the brain but patient declined to schedule it when they called and they reiterated that decision previously.  She has no significant neck pain, so we did not order an MRI of the cervical spine. Subjective:   Valerie Nguyen was seen today in follow up for Parkinsons disease, diagnosed last visit.  My previous records were reviewed prior to todays visit as well as outside records available to me.  Patient with daughter in law who supplements history.  Unfortunately, I have not seen the patient in over a year due to several canceled appointments.***She has had no falls since last visit.  No lightheadedness or near syncope.   Current prescribed movement disorder medications: Carbidopa /levodopa  25/100, 1 tablet at 10 AM/2 PM/6 PM    ALLERGIES:  No Known Allergies  CURRENT MEDICATIONS:  No outpatient medications have been marked as taking for the 11/25/23 encounter (Appointment) with Dealie Koelzer, [place] RAMAN, DO.     Objective:   PHYSICAL EXAMINATION:    VITALS:   There were no vitals filed for this visit.   GEN:  The patient appears stated age and is in NAD. HEENT:  Normocephalic,  atraumatic.  The mucous membranes are moist. The superficial temporal arteries are without ropiness or tenderness. CV:  RRR Lungs:  CTAB Neck/HEME:  There are no carotid bruits bilaterally.  Neurological examination:  Orientation: The patient is alert and oriented x3.  Cranial nerves: There is good facial symmetry. There is facial hypomimia.  Extraocular muscles are intact. The visual fields are full to confrontational testing. The speech is fluent and clear. Soft palate rises symmetrically and there is no tongue deviation. Hearing is intact to conversational tone. Sensation: Sensation is intact to light touch throughout. Motor: Strength is 5/5 in the bilateral upper and lower extremities.   Shoulder shrug is equal and symmetric.  There is no pronator drift.    Movement examination: Tone: There is mild increased tone in the L>RUE Abnormal movements: none Coordination:  There is mild to mod decremation with RAM's, with any form of RAMS, including alternating supination and pronation of the forearm, hand opening and closing, finger taps, heel taps and toe taps, L>R Gait and Station: The patient pushes off to arise.  She is given a walker.  She drags the L leg.  She has trouble approximating the chair when she gets back, but she is able to gently place herself  back into the chair once she is helped in front of the chair.  I have reviewed and interpreted the following labs independently    Chemistry      Component Value Date/Time   NA 142 09/20/2023 1755   K 3.4 (L) 09/20/2023 1755   CL 107 09/20/2023 1755   CO2 22 09/20/2023 1755   BUN 19 09/20/2023 1755   CREATININE 0.92 09/20/2023 1755      Component Value Date/Time   CALCIUM 10.0 09/20/2023 1755   ALKPHOS 64 09/20/2023 1755   AST 13 (L) 09/20/2023 1755   ALT <5 09/20/2023 1755   BILITOT 0.4 09/20/2023 1755       Lab Results  Component Value Date   WBC 6.5 09/20/2023   HGB 12.1 09/20/2023   HCT 37.1 09/20/2023   MCV 93.7  09/20/2023   PLT 220 09/20/2023    No results found for: TSH   Total time spent on today's visit was 40 minutes, including both face-to-face time and nonface-to-face time.  Time included that spent on review of records (prior notes available to me/labs/imaging if pertinent), discussing treatment and goals, answering patient's questions and coordinating care.  Cc:  Claudene Pellet, MD

## 2023-11-25 ENCOUNTER — Ambulatory Visit: Admitting: Neurology

## 2023-11-25 VITALS — BP 126/72 | HR 53 | Ht 62.0 in | Wt 125.0 lb

## 2023-11-25 DIAGNOSIS — G20A1 Parkinson's disease without dyskinesia, without mention of fluctuations: Secondary | ICD-10-CM | POA: Diagnosis not present

## 2023-11-25 DIAGNOSIS — F02A2 Dementia in other diseases classified elsewhere, mild, with psychotic disturbance: Secondary | ICD-10-CM | POA: Diagnosis not present

## 2023-11-25 NOTE — Patient Instructions (Signed)
 Register Now!  We are planning a Parkinsons Disease educational symposium at [facility], [address] [place], [place], [place] [address] on September 19.  We will have a movement disorder physician expert from [employer] coming to speak and a caregiver speaker.  We will have a panel of experts that will show you who you may need on your team of people on your journey with Parkinsons.  I hope to see you there!  Use this QR code to register by scanning it with the camera app on your phone:      Need more help with registration?  Contact [email or web address]

## 2023-12-09 ENCOUNTER — Other Ambulatory Visit (HOSPITAL_COMMUNITY): Payer: Self-pay

## 2023-12-10 ENCOUNTER — Other Ambulatory Visit: Payer: Self-pay

## 2023-12-10 ENCOUNTER — Other Ambulatory Visit (HOSPITAL_COMMUNITY): Payer: Self-pay

## 2023-12-10 MED ORDER — ESCITALOPRAM OXALATE 10 MG PO TABS
10.0000 mg | ORAL_TABLET | Freq: Every day | ORAL | 1 refills | Status: AC
  Filled 2023-12-10: qty 90, 90d supply, fill #0
  Filled 2024-03-02: qty 90, 90d supply, fill #1

## 2023-12-29 ENCOUNTER — Other Ambulatory Visit: Payer: Self-pay | Admitting: Neurology

## 2023-12-29 DIAGNOSIS — G20A1 Parkinson's disease without dyskinesia, without mention of fluctuations: Secondary | ICD-10-CM

## 2023-12-30 ENCOUNTER — Other Ambulatory Visit: Payer: Self-pay

## 2023-12-30 ENCOUNTER — Other Ambulatory Visit (HOSPITAL_COMMUNITY): Payer: Self-pay

## 2023-12-30 MED ORDER — CARBIDOPA-LEVODOPA 25-100 MG PO TABS
1.0000 | ORAL_TABLET | Freq: Three times a day (TID) | ORAL | 1 refills | Status: AC
  Filled 2023-12-30: qty 270, 90d supply, fill #0

## 2024-02-03 ENCOUNTER — Other Ambulatory Visit (HOSPITAL_COMMUNITY): Payer: Self-pay

## 2024-02-03 DIAGNOSIS — I10 Essential (primary) hypertension: Secondary | ICD-10-CM | POA: Diagnosis not present

## 2024-02-03 MED ORDER — DONEPEZIL HCL 10 MG PO TABS
10.0000 mg | ORAL_TABLET | Freq: Every day | ORAL | 1 refills | Status: AC
  Filled 2024-02-03 – 2024-03-02 (×2): qty 90, 90d supply, fill #0

## 2024-02-03 MED ORDER — SIMVASTATIN 40 MG PO TABS
ORAL_TABLET | ORAL | 1 refills | Status: AC
  Filled 2024-02-03: qty 90, 90d supply, fill #0

## 2024-02-03 MED ORDER — ESCITALOPRAM OXALATE 10 MG PO TABS
ORAL_TABLET | ORAL | 1 refills | Status: AC
  Filled 2024-02-03: qty 90, 90d supply, fill #0

## 2024-03-02 ENCOUNTER — Other Ambulatory Visit: Payer: Self-pay

## 2024-03-02 ENCOUNTER — Other Ambulatory Visit (HOSPITAL_COMMUNITY): Payer: Self-pay

## 2024-04-06 ENCOUNTER — Other Ambulatory Visit (HOSPITAL_COMMUNITY): Payer: Self-pay

## 2024-04-06 MED ORDER — SIMVASTATIN 40 MG PO TABS
40.0000 mg | ORAL_TABLET | Freq: Every day | ORAL | 1 refills | Status: AC
  Filled 2024-04-06: qty 90, 90d supply, fill #0

## 2024-06-27 ENCOUNTER — Ambulatory Visit: Admitting: Neurology
